# Patient Record
Sex: Female | Born: 1995 | Race: Black or African American | Hispanic: No | Marital: Single | Smoking: Current every day smoker
Health system: Southern US, Community
[De-identification: ages and names within clinical notes are randomized; demographics above are authoritative.]

## PROBLEM LIST (undated history)

## (undated) DIAGNOSIS — G43709 Chronic migraine without aura, not intractable, without status migrainosus: Secondary | ICD-10-CM

## (undated) DIAGNOSIS — IMO0002 Reserved for concepts with insufficient information to code with codable children: Secondary | ICD-10-CM

## (undated) HISTORY — PX: EXTERNAL EAR SURGERY: SHX627

---

## 2015-09-16 ENCOUNTER — Emergency Department (HOSPITAL_COMMUNITY)
Admission: EM | Admit: 2015-09-16 | Discharge: 2015-09-16 | Disposition: A | Discharge status: Home / Self Care | Payer: Self-pay | Attending: Emergency Medicine | Admitting: Emergency Medicine

## 2015-09-16 ENCOUNTER — Emergency Department (HOSPITAL_COMMUNITY): Payer: Self-pay

## 2015-09-16 ENCOUNTER — Encounter (HOSPITAL_COMMUNITY): Payer: Self-pay | Admitting: Neurology

## 2015-09-16 DIAGNOSIS — R509 Fever, unspecified: Secondary | ICD-10-CM | POA: Insufficient documentation

## 2015-09-16 DIAGNOSIS — R109 Unspecified abdominal pain: Secondary | ICD-10-CM | POA: Insufficient documentation

## 2015-09-16 DIAGNOSIS — Z87442 Personal history of urinary calculi: Secondary | ICD-10-CM | POA: Insufficient documentation

## 2015-09-16 DIAGNOSIS — R110 Nausea: Secondary | ICD-10-CM | POA: Insufficient documentation

## 2015-09-16 DIAGNOSIS — F17200 Nicotine dependence, unspecified, uncomplicated: Secondary | ICD-10-CM | POA: Insufficient documentation

## 2015-09-16 DIAGNOSIS — R42 Dizziness and giddiness: Secondary | ICD-10-CM | POA: Insufficient documentation

## 2015-09-16 LAB — URINE MICROSCOPIC-ADD ON

## 2015-09-16 LAB — CBC
HEMATOCRIT: 41.5 % (ref 36.0–46.0)
HEMOGLOBIN: 13.8 g/dL (ref 12.0–15.0)
MCH: 29.9 pg (ref 26.0–34.0)
MCHC: 33.3 g/dL (ref 30.0–36.0)
MCV: 89.8 fL (ref 78.0–100.0)
Platelets: 158 10*3/uL (ref 150–400)
RBC: 4.62 MIL/uL (ref 3.87–5.11)
RDW: 13.4 % (ref 11.5–15.5)
WBC: 9.7 10*3/uL (ref 4.0–10.5)

## 2015-09-16 LAB — COMPREHENSIVE METABOLIC PANEL
ALBUMIN: 4 g/dL (ref 3.5–5.0)
ALK PHOS: 58 U/L (ref 38–126)
ALT: 13 U/L — ABNORMAL LOW (ref 14–54)
ANION GAP: 10 (ref 5–15)
AST: 14 U/L — ABNORMAL LOW (ref 15–41)
BILIRUBIN TOTAL: 1.5 mg/dL — AB (ref 0.3–1.2)
BUN: 6 mg/dL (ref 6–20)
CALCIUM: 9.2 mg/dL (ref 8.9–10.3)
CO2: 22 mmol/L (ref 22–32)
Chloride: 106 mmol/L (ref 101–111)
Creatinine, Ser: 0.82 mg/dL (ref 0.44–1.00)
GFR calc non Af Amer: >60 mL/min (ref >60)
Glucose, Bld: 101 mg/dL — ABNORMAL HIGH (ref 65–99)
POTASSIUM: 3.5 mmol/L (ref 3.5–5.1)
SODIUM: 138 mmol/L (ref 135–145)
TOTAL PROTEIN: 7.2 g/dL (ref 6.5–8.1)

## 2015-09-16 LAB — URINALYSIS, ROUTINE W REFLEX MICROSCOPIC
BILIRUBIN URINE: NEGATIVE
Glucose, UA: NEGATIVE mg/dL
Ketones, ur: 15 mg/dL — AB
NITRITE: POSITIVE — AB
PH: 6 (ref 5.0–8.0)
Protein, ur: >300 mg/dL — AB
SPECIFIC GRAVITY, URINE: 1.019 (ref 1.005–1.030)

## 2015-09-16 LAB — I-STAT BETA HCG BLOOD, ED (MC, WL, AP ONLY)

## 2015-09-16 LAB — LIPASE, BLOOD: Lipase: 20 U/L (ref 11–51)

## 2015-09-16 MED ORDER — HYDROCODONE-ACETAMINOPHEN 5-325 MG PO TABS: 1.0000 | ORAL_TABLET | Freq: Four times a day (QID) | ORAL | Status: DC | PRN

## 2015-09-16 MED ORDER — CIPROFLOXACIN HCL 500 MG PO TABS: 500.0000 mg | ORAL_TABLET | Freq: Two times a day (BID) | ORAL | Status: DC

## 2015-09-16 MED ORDER — KETOROLAC TROMETHAMINE 30 MG/ML IJ SOLN: 30.0000 mg | Freq: Once | INTRAMUSCULAR | Status: DC

## 2015-09-16 MED ORDER — ONDANSETRON 4 MG PO TBDP
8.0000 mg | ORAL_TABLET | Freq: Once | ORAL | Status: AC
  Administered 2015-09-16: 8 mg via ORAL
  Filled 2015-09-16: qty 2

## 2015-09-16 MED ORDER — KETOROLAC TROMETHAMINE 30 MG/ML IJ SOLN
30.0000 mg | Freq: Once | INTRAMUSCULAR | Status: AC
  Administered 2015-09-16: 30 mg via INTRAMUSCULAR
  Filled 2015-09-16: qty 1

## 2015-09-16 MED ORDER — ONDANSETRON HCL 4 MG PO TABS: 4.0000 mg | ORAL_TABLET | Freq: Four times a day (QID) | ORAL | Status: AC

## 2015-09-16 MED ORDER — ONDANSETRON HCL 4 MG/2ML IJ SOLN: 4.0000 mg | Freq: Once | INTRAMUSCULAR | Status: DC

## 2015-09-16 NOTE — ED Notes (Signed)
Patient transported to CT 

## 2015-09-16 NOTE — Discharge Instructions (Signed)

## 2015-09-16 NOTE — ED Notes (Signed)
PA [facility] bedside.

## 2015-09-16 NOTE — ED Provider Notes (Signed)
CSN: [id number]     Arrival date & time 09/16/15  1458 History   First MD Initiated Contact with Patient 09/16/15 1804     Chief Complaint  Patient presents with  . Fever  . Abdominal Pain     (Consider location/radiation/quality/duration/timing/severity/associated sxs/prior Treatment) HPI Comments: Patient presents to the emergency department with chief complaint of left flank pain. She states the pain started last night. She reports pain is severe. She reports that she has had a kidney stone in the past, but does not remember if it felt this bad. She reports some associated nausea, but denies any vomiting, diarrhea, or constipation. She denies any dysuria or hematuria. She states that she is on her period. She has not tried taking anything to alleviate her symptoms. She reports associated subjective fever, but has not measured her temperature. Also states that she has felt slightly dizzy. She denies any prior abdominal surgeries. Denies any other associated symptoms.  The history is provided by the patient. No language interpreter was used.    History reviewed. No pertinent past medical history. History reviewed. No pertinent past surgical history. No family history on file. Social History  Substance Use Topics  . Smoking status: Current Every Day Smoker  . Smokeless tobacco: None  . Alcohol Use: No   OB History    No data available     Review of Systems  Constitutional: Negative for fever and chills.  Respiratory: Negative for shortness of breath.   Cardiovascular: Negative for chest pain.  Gastrointestinal: Positive for nausea and abdominal pain. Negative for vomiting, diarrhea and constipation.  Genitourinary: Negative for dysuria.  All other systems reviewed and are negative.     Allergies  Review of patient's allergies indicates no known allergies.  Home Medications   Prior to Admission medications   Not on File   BP 134/71 mmHg  Pulse 85  Temp(Src) 99 F  (37.2 C) (Oral)  Resp 22  SpO2 100%  LMP 09/16/2015 Physical Exam  Constitutional: She is oriented to person, place, and time. She appears well-developed and well-nourished.  HENT:  Head: Normocephalic and atraumatic.  Eyes: Conjunctivae and EOM are normal. Pupils are equal, round, and reactive to light.  Neck: Normal range of motion. Neck supple.  Cardiovascular: Normal rate and regular rhythm.  Exam reveals no gallop and no friction rub.   No murmur heard. Pulmonary/Chest: Effort normal and breath sounds normal. No respiratory distress. She has no wheezes. She has no rales. She exhibits no tenderness.  Abdominal: Soft. Bowel sounds are normal. She exhibits no distension and no mass. There is tenderness. There is no rebound and no guarding.  Left-sided CVA tenderness, there is left flank pain with palpation of any location of abdomen  Musculoskeletal: Normal range of motion. She exhibits no edema or tenderness.  Neurological: She is alert and oriented to person, place, and time.  Skin: Skin is warm and dry.  Psychiatric: She has a normal mood and affect. Her behavior is normal. Judgment and thought content normal.  Nursing note and vitals reviewed.   ED Course  Procedures (including critical care time) Results for orders placed or performed during the hospital encounter of 09/16/15  Lipase, blood  Result Value Ref Range   Lipase 20 11 - 51 U/L  Comprehensive metabolic panel  Result Value Ref Range   Sodium 138 135 - 145 mmol/L   Potassium 3.5 3.5 - 5.1 mmol/L   Chloride 106 101 - 111 mmol/L   CO2 22  22 - 32 mmol/L   Glucose, Bld 101 (H) 65 - 99 mg/dL   BUN 6 6 - 20 mg/dL   Creatinine, Ser 1.61 0.44 - 1.00 mg/dL   Calcium 9.2 8.9 - 09.6 mg/dL   Total Protein 7.2 6.5 - 8.1 g/dL   Albumin 4.0 3.5 - 5.0 g/dL   AST 14 (L) 15 - 41 U/L   ALT 13 (L) 14 - 54 U/L   Alkaline Phosphatase 58 38 - 126 U/L   Total Bilirubin 1.5 (H) 0.3 - 1.2 mg/dL   GFR calc non Af Amer >60 >60 mL/min    GFR calc Af Amer >60 >60 mL/min   Anion gap 10 5 - 15  CBC  Result Value Ref Range   WBC 9.7 4.0 - 10.5 K/uL   RBC 4.62 3.87 - 5.11 MIL/uL   Hemoglobin 13.8 12.0 - 15.0 g/dL   HCT 04.5 40.9 - 81.1 %   MCV 89.8 78.0 - 100.0 fL   MCH 29.9 26.0 - 34.0 pg   MCHC 33.3 30.0 - 36.0 g/dL   RDW 91.4 78.2 - 95.6 %   Platelets 158 150 - 400 K/uL  Urinalysis, Routine w reflex microscopic (not at [facility])  Result Value Ref Range   Color, Urine BROWN (A) YELLOW   APPearance TURBID (A) CLEAR   Specific Gravity, Urine 1.019 1.005 - 1.030   pH 6.0 5.0 - 8.0   Glucose, UA NEGATIVE NEGATIVE mg/dL   Hgb urine dipstick LARGE (A) NEGATIVE   Bilirubin Urine NEGATIVE NEGATIVE   Ketones, ur 15 (A) NEGATIVE mg/dL   Protein, ur >213 (A) NEGATIVE mg/dL   Nitrite POSITIVE (A) NEGATIVE   Leukocytes, UA LARGE (A) NEGATIVE  Urine microscopic-add on  Result Value Ref Range   Squamous Epithelial / LPF 6-30 (A) NONE SEEN   WBC, UA TOO NUMEROUS TO COUNT 0 - 5 WBC/hpf   RBC / HPF TOO NUMEROUS TO COUNT 0 - 5 RBC/hpf   Bacteria, UA MANY (A) NONE SEEN   Casts GRANULAR CAST (A) NEGATIVE   Urine-Other MUCOUS PRESENT   I-Stat beta hCG blood, ED ([facility], [facility], [facility] only)  Result Value Ref Range   I-stat hCG, quantitative <5.0 <5 mIU/mL   Comment 3           Ct Abdomen Pelvis Wo Contrast  09/16/2015  CLINICAL DATA:  Left flank and left lower quadrant pain since last night EXAM: CT ABDOMEN AND PELVIS WITHOUT CONTRAST TECHNIQUE: Multidetector CT imaging of the abdomen and pelvis was performed following the standard protocol without IV contrast. COMPARISON:  None. FINDINGS: There is left hydroureteronephrosis with stranding surrounding the left renal pelvis and ureter. No focal obstructing stone is identified in the bilateral collecting systems. No nephrolithiasis is identified bilaterally. The liver, spleen, pancreas, gallbladder, adrenal glands are normal. The aorta is normal. There is no abdominal lymphadenopathy. There is no  small bowel obstruction or diverticulitis. The appendix is normal. Partial fluid-filled bladder is normal. The uterus is normal. The lung bases are clear. 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 IMPRESSION: Left hydroureteronephrosis without focal obstructing stone. Differential diagnosis includes recently passed stone or pyelonephritis. Electronically Signed   By: Sherian Rein M.D.   On: 09/16/2015 19:27    I have personally reviewed and evaluated these images and lab results as part of my medical decision-making.   EKG Interpretation None      MDM   Final diagnoses:  Flank pain    Patient with left  flank pain. 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  Pain at left flank with palpation of abdomen.  Afebrile.  VSS. Will treat with toradol and zofran.  Labs are unremarkable.  Urinalysis is dirty.  Likely contaminated with menstrual discharge.  Will check urine culture.  Patient does have hydronephrosis on left.  Could be recently passed stone vs pyelo.   Will treat with cipro, zofran, and norco.  Return precautions given.  Patient is stable and ready for discharge.     Roxy Horsemanobert Brandalynn Ofallon, PA-C 09/16/15 1953  Rolland PorterMark James, MD 09/23/15 [phone]

## 2015-09-16 NOTE — ED Notes (Signed)
Pt reports LLQ abd pain, fever, dizziness since last night. Denies vomiting. Denies urinary s/s, is on her period.

## 2016-03-28 ENCOUNTER — Emergency Department (HOSPITAL_COMMUNITY)
Admission: EM | Admit: 2016-03-28 | Discharge: 2016-03-28 | Disposition: A | Discharge status: Home / Self Care | Payer: BLUE CROSS/BLUE SHIELD | Source: Home / Self Care

## 2016-03-28 ENCOUNTER — Inpatient Hospital Stay (HOSPITAL_COMMUNITY)
Admission: EM | Admit: 2016-03-28 | Discharge: 2016-03-30 | DRG: 781 | Disposition: A | Discharge status: Home / Self Care | Payer: BLUE CROSS/BLUE SHIELD | Attending: Family Medicine | Admitting: Family Medicine

## 2016-03-28 ENCOUNTER — Encounter (HOSPITAL_COMMUNITY): Payer: Self-pay

## 2016-03-28 ENCOUNTER — Encounter (HOSPITAL_COMMUNITY): Payer: Self-pay | Admitting: Emergency Medicine

## 2016-03-28 DIAGNOSIS — Z9889 Other specified postprocedural states: Secondary | ICD-10-CM

## 2016-03-28 DIAGNOSIS — Z3A01 Less than 8 weeks gestation of pregnancy: Secondary | ICD-10-CM | POA: Diagnosis not present

## 2016-03-28 DIAGNOSIS — L02415 Cutaneous abscess of right lower limb: Secondary | ICD-10-CM

## 2016-03-28 DIAGNOSIS — L0390 Cellulitis, unspecified: Secondary | ICD-10-CM

## 2016-03-28 DIAGNOSIS — O2341 Unspecified infection of urinary tract in pregnancy, first trimester: Secondary | ICD-10-CM | POA: Diagnosis present

## 2016-03-28 DIAGNOSIS — F17200 Nicotine dependence, unspecified, uncomplicated: Secondary | ICD-10-CM | POA: Diagnosis present

## 2016-03-28 DIAGNOSIS — F17210 Nicotine dependence, cigarettes, uncomplicated: Secondary | ICD-10-CM

## 2016-03-28 DIAGNOSIS — L0291 Cutaneous abscess, unspecified: Secondary | ICD-10-CM | POA: Diagnosis not present

## 2016-03-28 DIAGNOSIS — Z79899 Other long term (current) drug therapy: Secondary | ICD-10-CM

## 2016-03-28 DIAGNOSIS — L03115 Cellulitis of right lower limb: Secondary | ICD-10-CM | POA: Diagnosis present

## 2016-03-28 DIAGNOSIS — O209 Hemorrhage in early pregnancy, unspecified: Secondary | ICD-10-CM | POA: Diagnosis present

## 2016-03-28 DIAGNOSIS — Z331 Pregnant state, incidental: Secondary | ICD-10-CM | POA: Diagnosis not present

## 2016-03-28 DIAGNOSIS — Z5321 Procedure and treatment not carried out due to patient leaving prior to being seen by health care provider: Secondary | ICD-10-CM | POA: Insufficient documentation

## 2016-03-28 DIAGNOSIS — R509 Fever, unspecified: Secondary | ICD-10-CM | POA: Diagnosis present

## 2016-03-28 DIAGNOSIS — Z833 Family history of diabetes mellitus: Secondary | ICD-10-CM | POA: Diagnosis not present

## 2016-03-28 DIAGNOSIS — O99331 Smoking (tobacco) complicating pregnancy, first trimester: Secondary | ICD-10-CM | POA: Diagnosis present

## 2016-03-28 DIAGNOSIS — O26891 Other specified pregnancy related conditions, first trimester: Secondary | ICD-10-CM | POA: Diagnosis present

## 2016-03-28 DIAGNOSIS — Z3490 Encounter for supervision of normal pregnancy, unspecified, unspecified trimester: Secondary | ICD-10-CM

## 2016-03-28 HISTORY — DX: Reserved for concepts with insufficient information to code with codable children: IMO0002

## 2016-03-28 HISTORY — DX: Chronic migraine without aura, not intractable, without status migrainosus: G43.709

## 2016-03-28 LAB — COMPREHENSIVE METABOLIC PANEL
ALBUMIN: 3.6 g/dL (ref 3.5–5.0)
ALT: 22 U/L (ref 14–54)
ANION GAP: 5 (ref 5–15)
AST: 21 U/L (ref 15–41)
Alkaline Phosphatase: 55 U/L (ref 38–126)
BILIRUBIN TOTAL: 1 mg/dL (ref 0.3–1.2)
BUN: 6 mg/dL (ref 6–20)
CHLORIDE: 106 mmol/L (ref 101–111)
CO2: 23 mmol/L (ref 22–32)
Calcium: 8.8 mg/dL — ABNORMAL LOW (ref 8.9–10.3)
Creatinine, Ser: 0.8 mg/dL (ref 0.44–1.00)
GFR calc Af Amer: >60 mL/min (ref >60)
GLUCOSE: 93 mg/dL (ref 65–99)
POTASSIUM: 4.2 mmol/L (ref 3.5–5.1)
Sodium: 134 mmol/L — ABNORMAL LOW (ref 135–145)
TOTAL PROTEIN: 6.7 g/dL (ref 6.5–8.1)

## 2016-03-28 LAB — I-STAT BETA HCG BLOOD, ED (MC, WL, AP ONLY): I-stat hCG, quantitative: >2000 m[IU]/mL — ABNORMAL HIGH (ref <5)

## 2016-03-28 LAB — CBC WITH DIFFERENTIAL/PLATELET
BASOS ABS: 0 10*3/uL (ref 0.0–0.1)
Basophils Relative: 0 %
Eosinophils Absolute: 0.1 10*3/uL (ref 0.0–0.7)
Eosinophils Relative: 2 %
HEMATOCRIT: 35.7 % — AB (ref 36.0–46.0)
HEMOGLOBIN: 12 g/dL (ref 12.0–15.0)
LYMPHS ABS: 1.2 10*3/uL (ref 0.7–4.0)
LYMPHS PCT: 14 %
MCH: 29.3 pg (ref 26.0–34.0)
MCHC: 33.6 g/dL (ref 30.0–36.0)
MCV: 87.3 fL (ref 78.0–100.0)
Monocytes Absolute: 0.6 10*3/uL (ref 0.1–1.0)
Monocytes Relative: 6 %
NEUTROS ABS: 7.1 10*3/uL (ref 1.7–7.7)
Neutrophils Relative %: 78 %
Platelets: 167 10*3/uL (ref 150–400)
RBC: 4.09 MIL/uL (ref 3.87–5.11)
RDW: 13.4 % (ref 11.5–15.5)
WBC: 9 10*3/uL (ref 4.0–10.5)

## 2016-03-28 MED ORDER — CLINDAMYCIN PHOSPHATE 900 MG/50ML IV SOLN
900.0000 mg | Freq: Once | INTRAVENOUS | Status: AC
  Administered 2016-03-28: 900 mg via INTRAVENOUS
  Filled 2016-03-28: qty 50

## 2016-03-28 MED ORDER — MORPHINE SULFATE (PF) 4 MG/ML IV SOLN
4.0000 mg | Freq: Once | INTRAVENOUS | Status: AC
  Administered 2016-03-28: 4 mg via INTRAVENOUS
  Filled 2016-03-28: qty 1

## 2016-03-28 NOTE — ED Notes (Signed)
Pt states that she came in today after having the packing taken out of an abscess that was lanced on Saturday. The area is on her R inner thigh and is very red and appears to be infected. Sent in for further eval/antibiotics. Alert and oriented.

## 2016-03-28 NOTE — ED Notes (Signed)
Patient states that she had pain in her upper right thigh from a boil that was drained at an urgent care on Saturday 6/10. It was drained and packed with gauze, dressing still in place. Site appears red and swollen, warm and extremely painful to touch.

## 2016-03-28 NOTE — ED Provider Notes (Signed)
CSN: [id number]     Arrival date & time 03/28/16  1745 History   First MD Initiated Contact with Patient 03/28/16 2100     Chief Complaint  Patient presents with  . Wound Infection     (Consider location/radiation/quality/duration/timing/severity/associated sxs/prior Treatment) HPI  Patient is a 20 yo F with no significant PMHx presenting to the emergency room complaining of worsening pain, erythema, and swelling of a boil on her right inner thigh. Reports noticing a boil on right inner thigh 4 days ago which became worse in a day. States she went to urgent care 2 days ago and they cut it open and packed it. She was given bactrim and naproxen by urgent care which she has been taking daily. Patient is concerned that the erythema has now extended beyond her dressing and the area continues to be intermittently painful. Reports having fevers and chills. Reports feeling nauseous for the past 2 weeks; no vomiting. States her LMP was on May1st. No other complaints.   History reviewed. No pertinent past medical history. History reviewed. No pertinent past surgical history. History reviewed. No pertinent family history. Social History  Substance Use Topics  . Smoking status: Current Every Day Smoker  . Smokeless tobacco: None  . Alcohol Use: No   OB History    No data available     Review of Systems  Constitutional: Positive for fever and chills.  HENT: Negative for congestion and sore throat.   Eyes: Negative for pain and visual disturbance.  Respiratory: Negative for cough, shortness of breath and wheezing.   Cardiovascular: Negative for chest pain and palpitations.  Gastrointestinal: Negative for nausea, vomiting and abdominal distention.  Genitourinary: Negative for dysuria and flank pain.  Musculoskeletal: Negative for myalgias and arthralgias.  Skin: Negative for pallor and rash.       Boil on right inner thigh   Neurological: Negative for weakness, light-headedness and numbness.       Allergies  Review of patient's allergies indicates no known allergies.  Home Medications   Prior to Admission medications   Medication Sig Start Date End Date Taking? Authorizing Provider  ibuprofen (ADVIL,MOTRIN) 200 MG tablet Take 200 mg by mouth every 6 (six) hours as needed for moderate pain.    Yes Historical Provider, MD  naproxen (NAPROSYN) 500 MG tablet Take 500 mg by mouth 2 (two) times daily. for 10 days 03/26/16  Yes Historical Provider, MD  naproxen sodium (ANAPROX) 220 MG tablet Take 440 mg by mouth 2 (two) times daily as needed (pain and headache).   Yes Historical Provider, MD  sulfamethoxazole-trimethoprim (BACTRIM DS,SEPTRA DS) 800-160 MG tablet take 1 tablet by mouth every 12 hours for 10 days 03/26/16  Yes Historical Provider, MD  ciprofloxacin (CIPRO) 500 MG tablet Take 1 tablet (500 mg total) by mouth 2 (two) times daily. Patient not taking: Reported on 03/28/2016 09/16/15   Roxy Horseman, PA-C  HYDROcodone-acetaminophen (NORCO/VICODIN) 5-325 MG tablet Take 1-2 tablets by mouth every 6 (six) hours as needed. Patient not taking: Reported on 03/28/2016 09/16/15   Roxy Horseman, PA-C  ondansetron (ZOFRAN) 4 MG tablet Take 1 tablet (4 mg total) by mouth every 6 (six) hours. Patient not taking: Reported on 03/28/2016 09/16/15   Roxy Horseman, PA-C   BP 132/59 mmHg  Pulse 96  Temp(Src) 98.6 F (37 C) (Oral)  Resp 18  SpO2 100%  LMP 02/16/2016 Physical Exam  Constitutional: She is oriented to person, place, and time. She appears well-developed and well-nourished. No distress.  HENT:  Head: Normocephalic and atraumatic.  Mouth/Throat: Oropharynx is clear and moist.  Eyes: EOM are normal. Pupils are equal, round, and reactive to light.  Neck: Neck supple. No tracheal deviation present.  Cardiovascular: Normal rate, regular rhythm and intact distal pulses.  Exam reveals no gallop and no friction rub.   No murmur heard. Pulmonary/Chest: Effort normal and breath  sounds normal. No respiratory distress. She has no wheezes. She has no rales.  Abdominal: Soft. Bowel sounds are normal. She exhibits no distension. There is no tenderness. There is no guarding.  Musculoskeletal: Normal range of motion. She exhibits no tenderness.  Neurological: She is alert and oriented to person, place, and time.  Skin: Skin is warm and dry.  Abscess on right inner thigh with erythema and increased warmth extending beyond the area of packing. No pus noted.  Please see attached image.       ED Course  Procedures (including critical care time) Labs Review Labs Reviewed - No data to display  Imaging Review No results found. I have personally reviewed and evaluated these images and lab results as part of my medical decision-making.   EKG Interpretation None      MDM   Final diagnoses:  None   Pregnancy  Patient has been complaining of nausea for the past 2 weeks. LMP >7 weeks ago. Beta hCG >2000.   Right thigh cellulitis Patient is presenting with an abscess on her right inner thigh with surrounding erythema and increased warmth of skin despite taking Bactrim. She is afebrile and does not have leukocytosis. However, since she has failed outpatient bactrim therapy, she will need treatment with IV antibiotics.  -IV Clindamycin 900 mg once  -Morphine 4 mg once for pain -Wound culture pending  -Spoke to Dr. Melynda RippleHobbs ([facility]) and patient has been admitted under his care for further management.    John GiovanniVasundhra Quinnton Bury, MD 03/28/16 [phone]  Lyndal Pulleyaniel Knott, MD 03/29/16 [phone]

## 2016-03-28 NOTE — ED Notes (Signed)
Patient stated that she could not wait any longer so she left without being seen, called patient 3 times patient didn't answer

## 2016-03-29 ENCOUNTER — Encounter (HOSPITAL_COMMUNITY): Payer: Self-pay

## 2016-03-29 ENCOUNTER — Inpatient Hospital Stay (HOSPITAL_COMMUNITY): Payer: BLUE CROSS/BLUE SHIELD

## 2016-03-29 DIAGNOSIS — L0291 Cutaneous abscess, unspecified: Secondary | ICD-10-CM

## 2016-03-29 DIAGNOSIS — L03115 Cellulitis of right lower limb: Secondary | ICD-10-CM | POA: Diagnosis present

## 2016-03-29 DIAGNOSIS — L0390 Cellulitis, unspecified: Secondary | ICD-10-CM

## 2016-03-29 LAB — BASIC METABOLIC PANEL
Anion gap: 5 (ref 5–15)
BUN: 9 mg/dL (ref 6–20)
CHLORIDE: 107 mmol/L (ref 101–111)
CO2: 23 mmol/L (ref 22–32)
CREATININE: 0.68 mg/dL (ref 0.44–1.00)
Calcium: 8.2 mg/dL — ABNORMAL LOW (ref 8.9–10.3)
GFR calc non Af Amer: >60 mL/min (ref >60)
Glucose, Bld: 101 mg/dL — ABNORMAL HIGH (ref 65–99)
POTASSIUM: 3.5 mmol/L (ref 3.5–5.1)
Sodium: 135 mmol/L (ref 135–145)

## 2016-03-29 LAB — ABO/RH: ABO/RH(D): A POS

## 2016-03-29 LAB — CBC
HEMATOCRIT: 32.2 % — AB (ref 36.0–46.0)
Hemoglobin: 11.2 g/dL — ABNORMAL LOW (ref 12.0–15.0)
MCH: 29.9 pg (ref 26.0–34.0)
MCHC: 34.8 g/dL (ref 30.0–36.0)
MCV: 85.9 fL (ref 78.0–100.0)
Platelets: 173 10*3/uL (ref 150–400)
RBC: 3.75 MIL/uL — AB (ref 3.87–5.11)
RDW: 13.4 % (ref 11.5–15.5)
WBC: 8.6 10*3/uL (ref 4.0–10.5)

## 2016-03-29 LAB — HCG, QUANTITATIVE, PREGNANCY: hCG, Beta Chain, Quant, S: 10978 m[IU]/mL — ABNORMAL HIGH (ref <5)

## 2016-03-29 LAB — TYPE AND SCREEN
ABO/RH(D): A POS
ANTIBODY SCREEN: NEGATIVE

## 2016-03-29 MED ORDER — ONDANSETRON HCL 4 MG/2ML IJ SOLN: 4.0000 mg | Freq: Four times a day (QID) | INTRAMUSCULAR | Status: DC | PRN

## 2016-03-29 MED ORDER — ACETAMINOPHEN 325 MG PO TABS: 650.0000 mg | ORAL_TABLET | Freq: Four times a day (QID) | ORAL | Status: DC | PRN

## 2016-03-29 MED ORDER — PRENATAL MULTIVITAMIN CH
1.0000 | ORAL_TABLET | Freq: Every day | ORAL | Status: DC
  Administered 2016-03-29 – 2016-03-30 (×2): 1 via ORAL
  Filled 2016-03-29 (×3): qty 1

## 2016-03-29 MED ORDER — ACETAMINOPHEN 650 MG RE SUPP: 650.0000 mg | Freq: Four times a day (QID) | RECTAL | Status: DC | PRN

## 2016-03-29 MED ORDER — KCL IN DEXTROSE-NACL 20-5-0.45 MEQ/L-%-% IV SOLN
INTRAVENOUS | Status: AC
  Administered 2016-03-29: 01:00:00 via INTRAVENOUS
  Filled 2016-03-29: qty 1000

## 2016-03-29 MED ORDER — ONDANSETRON HCL 4 MG PO TABS
4.0000 mg | ORAL_TABLET | Freq: Four times a day (QID) | ORAL | Status: DC | PRN
  Administered 2016-03-29: 4 mg via ORAL
  Filled 2016-03-29: qty 1

## 2016-03-29 MED ORDER — CLINDAMYCIN PHOSPHATE 600 MG/50ML IV SOLN
600.0000 mg | Freq: Three times a day (TID) | INTRAVENOUS | Status: DC
  Administered 2016-03-30: 600 mg via INTRAVENOUS
  Filled 2016-03-29: qty 50

## 2016-03-29 MED ORDER — TRAZODONE HCL 50 MG PO TABS: 25.0000 mg | ORAL_TABLET | Freq: Every evening | ORAL | Status: DC | PRN

## 2016-03-29 NOTE — H&P (Signed)
History and Physical    Misty Bowen ZOX:[id number] DOB: [birth year] DOA: 03/28/2016  PCP: Pcp Not In System Patient coming from: home  Chief Complaint: "Infection just got bad."  HPI: Misty Bowen is a 20 y.o. female with medical history significant for chronic migraine presented to the emergency room for evaluation of skin infection of the right leg. Patient had gone to urgent care for evaluation of a boil on her right inner thigh. Patient has a history of these. Patient states that at urgent care this was lanced and was packed. Patient was started on Bactrim. Patient took the medication as directed. Patient went back for wound check 2 days later for evaluation and the wound was found to be very cellulitic. Patient was directed to come to the emergency room and did so the same day.  Of note patient has been nauseous for the last 2 weeks but no vomiting. Patient denies fever, chills, chest pain, trouble breathing, headache, syncope and weakness.  ED Course: Patient given clindamycin in the emergency room.  Review of Systems: As per HPI otherwise 10 point review of systems negative.   Past Medical History  Diagnosis Date  . Chronic migraine     Past Surgical History  Procedure Laterality Date  . External ear surgery Right      reports that she has been smoking.  She does not have any smokeless tobacco history on file. She reports that she does not drink alcohol. Her drug history is not on file.  No Known Allergies  Family History  Problem Relation Age of Onset  . Diabetes Maternal Grandmother     Prior to Admission medications   Medication Sig Start Date End Date Taking? Authorizing Provider  ibuprofen (ADVIL,MOTRIN) 200 MG tablet Take 200 mg by mouth every 6 (six) hours as needed for moderate pain.    Yes Historical Provider, MD  naproxen (NAPROSYN) 500 MG tablet Take 500 mg by mouth 2 (two) times daily. for 10 days 03/26/16  Yes Historical Provider, MD  naproxen sodium (ANAPROX)  220 MG tablet Take 440 mg by mouth 2 (two) times daily as needed (pain and headache).   Yes Historical Provider, MD  sulfamethoxazole-trimethoprim (BACTRIM DS,SEPTRA DS) 800-160 MG tablet take 1 tablet by mouth every 12 hours for 10 days 03/26/16  Yes Historical Provider, MD  ciprofloxacin (CIPRO) 500 MG tablet Take 1 tablet (500 mg total) by mouth 2 (two) times daily. Patient not taking: Reported on 03/28/2016 09/16/15   Roxy Horseman, PA-C  HYDROcodone-acetaminophen (NORCO/VICODIN) 5-325 MG tablet Take 1-2 tablets by mouth every 6 (six) hours as needed. Patient not taking: Reported on 03/28/2016 09/16/15   Roxy Horseman, PA-C  ondansetron (ZOFRAN) 4 MG tablet Take 1 tablet (4 mg total) by mouth every 6 (six) hours. Patient not taking: Reported on 03/28/2016 09/16/15   Roxy Horseman, PA-C    Physical Exam: Filed Vitals:   03/28/16 1912 03/28/16 2313 03/29/16 0020  BP: 132/59 126/56 137/90  Pulse: 96 76 84  Temp: 98.6 F (37 C)  98.6 F (37 C)  TempSrc: Oral  Oral  Resp: 18 16 18   SpO2: 100% 98% 100%      Constitutional: NAD, calm, comfortable Filed Vitals:   03/28/16 1912 03/28/16 2313 03/29/16 0020  BP: 132/59 126/56 137/90  Pulse: 96 76 84  Temp: 98.6 F (37 C)  98.6 F (37 C)  TempSrc: Oral  Oral  Resp: 18 16 18   SpO2: 100% 98% 100%   Eyes: PERRL, lids and conjunctivae  normal ENMT: Mucous membranes are moist. Posterior pharynx clear of any exudate or lesions.Normal dentition.  Neck: normal, supple, no masses, no thyromegaly Respiratory: clear to auscultation bilaterally, no wheezing, no crackles. Normal respiratory effort. No accessory muscle use.  Cardiovascular: Regular rate and rhythm, no murmurs / rubs / gallops. No extremity edema. 2+ pedal pulses. No carotid bruits.  Abdomen: no tenderness, no masses palpated. No hepatosplenomegaly. Bowel sounds positive.  Musculoskeletal: no clubbing / cyanosis. No joint deformity upper and lower extremities. Good ROM, no  contractures. Normal muscle tone.  Skin: Left proximal inner thigh cellulitis greater than 10 cm in diameter. Central induration with area where packing was removed leaving small defect. No expressible pus from defect. Neurologic: CN 2-12 grossly intact. Sensation intact, DTR normal. Strength 5/5 in all 4.  Psychiatric: Normal judgment and insight. Alert and oriented x 3. Normal mood.   Labs on Admission: I have personally reviewed following labs and imaging studies  CBC:  Recent Labs Lab 03/28/16 1328  WBC 9.0  NEUTROABS 7.1  HGB 12.0  HCT 35.7*  MCV 87.3  PLT 167   Basic Metabolic Panel:  Recent Labs Lab 03/28/16 1328  NA 134*  K 4.2  CL 106  CO2 23  GLUCOSE 93  BUN 6  CREATININE 0.80  CALCIUM 8.8*   GFR: Estimated Creatinine Clearance: 142.6 mL/min (by C-G formula based on Cr of 0.8). Liver Function Tests:  Recent Labs Lab 03/28/16 1328  AST 21  ALT 22  ALKPHOS 55  BILITOT 1.0  PROT 6.7  ALBUMIN 3.6   No results for input(s): LIPASE, AMYLASE in the last 168 hours. No results for input(s): AMMONIA in the last 168 hours. Coagulation Profile: No results for input(s): INR, PROTIME in the last 168 hours. Cardiac Enzymes: No results for input(s): CKTOTAL, CKMB, CKMBINDEX, TROPONINI in the last 168 hours. BNP (last 3 results) No results for input(s): PROBNP in the last 8760 hours. HbA1C: No results for input(s): HGBA1C in the last 72 hours. CBG: No results for input(s): GLUCAP in the last 168 hours. Lipid Profile: No results for input(s): CHOL, HDL, LDLCALC, TRIG, CHOLHDL, LDLDIRECT in the last 72 hours. Thyroid Function Tests: No results for input(s): TSH, T4TOTAL, FREET4, T3FREE, THYROIDAB in the last 72 hours. Anemia Panel: No results for input(s): VITAMINB12, FOLATE, FERRITIN, TIBC, IRON, RETICCTPCT in the last 72 hours. Urine analysis:    Component Value Date/Time   COLORURINE BROWN* 09/16/2015 1912   APPEARANCEUR TURBID* 09/16/2015 1912    LABSPEC 1.019 09/16/2015 1912   PHURINE 6.0 09/16/2015 1912   GLUCOSEU NEGATIVE 09/16/2015 1912   HGBUR LARGE* 09/16/2015 1912   BILIRUBINUR NEGATIVE 09/16/2015 1912   KETONESUR 15* 09/16/2015 1912   PROTEINUR >300* 09/16/2015 1912   NITRITE POSITIVE* 09/16/2015 1912   LEUKOCYTESUR LARGE* 09/16/2015 1912   Sepsis Labs: !!!!!!!!!!!!!!!!!!!!!!!!!!!!!!!!!!!!!!!!!!!! (procalcitonin:4,lacticidven:4) )No results found for this or any previous visit (from the past 240 hour(s)).   Radiological Exams on Admission: No results found.  EKG: pending  Assessment/Plan Principal Problem:   Cellulitis and abscess Active Problems:   Pregnancy   Cellulitis of right thigh   Abscess with cellulitis Wound culture sent Rocephin 2 g every 24 this will also cover patient's UTI Clindamycin IV every 8 hours Outlined cellulitis with body marker I suspect basal history patient has hidradenitis suppurativa  Pregnancy Transvaginal ultrasound ordered Prenatal vitamins daily  Chronic migraine When necessary Tylenol  DVT prophylaxis: SCD Code Status: Full Family Communication: Significant other in room Disposition Plan: Home Consults  called: None   Haydee SalterPhillip M Hobbs MD [facility] Pager [phone]  If 7PM-7AM, please contact night-coverage [email or web address] Password [email or web address]  03/29/2016, 12:47 AM

## 2016-03-29 NOTE — Progress Notes (Signed)
[facility] PLAN OF CARE NOTE PatientCharlotte Bowen: Misty Bowen IEP:[id number]:[id number]   PCP: Pcp Not In System DOB: 11-13-1995   DOA: 03/28/2016   DOS: 03/29/2016    Patient was admitted by my colleague Dr.Hobbs earlier on 03/29/2016. I have reviewed the H&P as well as assessment and plan and agree with the same. Important changes in the plan are listed below.  Plan of care: Principal Problem:   Cellulitis and abscess Status post IND in the urgent care. Still has open wound. There is no pus draining. Patient mentions wound is actually getting better. I will check ultrasound of the local area to identify possible abscess. The patient has further abscesses may require surgical consult to further IND.  Active Problems:   Pregnancy G1 P0 A0   Subchorionic hemorrhage. Ultrasound pelvis suggesting subchorionic hemorrhage. 5-6 weeks of pregnancy intrauterine 9, hCG consistent with pregnancy age. Continue prenatal vitamins. Ceftriaxone and clindamycin should be okay for discharge. Discussed with on-call OB/GYN and recommends no further workup and outpatient follow-up.  UTI. Patient has evidence of leukocytosis. With pregnancy this needs to be treated. Continue ceftriaxone until cultures and sensitivities are back.   Author: Lynden OxfordPranav Naevia Unterreiner, MD [facility] Pager: [phone] 03/29/2016 5:23 PM   If 7PM-7AM, please contact night-coverage at [email or web address], password [email or web address]

## 2016-03-30 DIAGNOSIS — Z331 Pregnant state, incidental: Secondary | ICD-10-CM

## 2016-03-30 LAB — URINE CULTURE: Culture: <10000 — AB

## 2016-03-30 LAB — CBC
HCT: 35.8 % — ABNORMAL LOW (ref 36.0–46.0)
HEMOGLOBIN: 12.2 g/dL (ref 12.0–15.0)
MCH: 29.8 pg (ref 26.0–34.0)
MCHC: 34.1 g/dL (ref 30.0–36.0)
MCV: 87.5 fL (ref 78.0–100.0)
Platelets: 197 10*3/uL (ref 150–400)
RBC: 4.09 MIL/uL (ref 3.87–5.11)
RDW: 13.3 % (ref 11.5–15.5)
WBC: 7.7 10*3/uL (ref 4.0–10.5)

## 2016-03-30 LAB — URINALYSIS, ROUTINE W REFLEX MICROSCOPIC
Bilirubin Urine: NEGATIVE
GLUCOSE, UA: NEGATIVE mg/dL
HGB URINE DIPSTICK: NEGATIVE
Ketones, ur: NEGATIVE mg/dL
LEUKOCYTES UA: NEGATIVE
Nitrite: NEGATIVE
Protein, ur: NEGATIVE mg/dL
SPECIFIC GRAVITY, URINE: 1.02 (ref 1.005–1.030)
pH: 6.5 (ref 5.0–8.0)

## 2016-03-30 MED ORDER — CLINDAMYCIN HCL 300 MG PO CAPS
450.0000 mg | ORAL_CAPSULE | Freq: Once | ORAL | Status: AC
  Administered 2016-03-30: 450 mg via ORAL
  Filled 2016-03-30: qty 1

## 2016-03-30 MED ORDER — PRENATAL MULTIVITAMIN CH: 1.0000 | ORAL_TABLET | Freq: Every day | ORAL | Status: AC

## 2016-03-30 MED ORDER — CLINDAMYCIN HCL 150 MG PO CAPS: 450.0000 mg | ORAL_CAPSULE | Freq: Three times a day (TID) | ORAL | Status: AC

## 2016-03-30 NOTE — Progress Notes (Signed)
Per nurse Stark KleinElsie, she received results of patient wound culture, + gram cocci, Md notified.

## 2016-03-30 NOTE — Progress Notes (Signed)
Patient d/c instructions given, verbalized understanding. Patient ambulated this shift. No c/o pain.

## 2016-03-30 NOTE — Discharge Summary (Addendum)
Physician Discharge Summary  [facility] UJW:[id number] DOB: [birth year] DOA: 03/28/2016  PCP: Pcp Not In System  Admit date: 03/28/2016 Discharge date: 03/30/2016  Time spent: 35* minutes  Recommendations for Outpatient Follow-up:  1. Follow PCP in 2 weeks   Discharge Diagnoses:  Principal Problem:   Cellulitis and abscess Active Problems:   Pregnancy   Cellulitis of right thigh   Discharge Condition: Stable  Diet recommendation: Regular diet  Filed Weights   03/29/16 0020  Weight: 119.296 kg (263 lb)    History of present illness:  20 y.o. female with medical history significant for chronic migraine presented to the emergency room for evaluation of skin infection of the right leg. Patient had gone to urgent care for evaluation of a boil on her right inner thigh. Patient has a history of these. Patient states that at urgent care this was lanced and was packed. Patient was started on Bactrim. Patient took the medication as directed. Patient went back for wound check 2 days later for evaluation and the wound was found to be very cellulitic. Patient was directed to come to the emergency room and did so the same day.  Hospital Course:  Cellulitis and abscess Status post I&D in the urgent care. Still has open wound. There is no pus draining.  Ultrasound of the thigh was done which did not show any abscess. Culture of the abscess Showed few gram-positive cocci. We will discharge patient on clindamycin 450 mg by mouth 3 times a day for 5 more days.   Subchorionic hemorrhage. Ultrasound pelvis suggesting subchorionic hemorrhage. 5-6 weeks of pregnancy intrauterine 9, hCG consistent with pregnancy age. Continue prenatal vitamins.  clindamycin should be okay for discharge. Discussed with on-call OB/GYN and recommends no further workup and outpatient follow-up.  Patient was empirically started on ceftriaxone, but UA and urine culture negative. Patient denies any symptoms of  dysuria. Will discontinue ceftriaxone at this time.  Procedures:  Transvaginal ultrasound  Ultrasound of thigh  Consultations:  None  Discharge Exam: Filed Vitals:   03/30/16 0615 03/30/16 0646  BP: 95/48 107/54  Pulse: 69   Temp: 98.5 F (36.9 C)   Resp: 18     General: Appears in no acute distress Cardiovascular: S1-S2 regular Respiratory: Clear to auscultation bilaterally Extremity- open wound noted on the right inner thigh, small induration but no visible drainage. No erythema. Mildly tender to palpation  Discharge Instructions   Discharge Instructions    Diet - low sodium heart healthy    Complete by:  As directed      Increase activity slowly    Complete by:  As directed           Current Discharge Medication List    START taking these medications   Details  clindamycin (CLEOCIN) 150 MG capsule Take 3 capsules (450 mg total) by mouth 3 (three) times daily. Qty: 15 capsule, Refills: 0    Prenatal Vit-Fe Fumarate-FA (PRENATAL MULTIVITAMIN) TABS tablet Take 1 tablet by mouth daily at 12 noon. Qty: 30 tablet, Refills: 2      CONTINUE these medications which have NOT CHANGED   Details  ondansetron (ZOFRAN) 4 MG tablet Take 1 tablet (4 mg total) by mouth every 6 (six) hours. Qty: 12 tablet, Refills: 0      STOP taking these medications     ibuprofen (ADVIL,MOTRIN) 200 MG tablet      naproxen (NAPROSYN) 500 MG tablet      naproxen sodium (ANAPROX) 220 MG tablet  sulfamethoxazole-trimethoprim (BACTRIM DS,SEPTRA DS) 800-160 MG tablet      ciprofloxacin (CIPRO) 500 MG tablet      HYDROcodone-acetaminophen (NORCO/VICODIN) 5-325 MG tablet        No Known Allergies    The results of significant diagnostics from this hospitalization (including imaging, microbiology, ancillary and laboratory) are listed below for reference.    Significant Diagnostic Studies: US Ob Comp Less 14 Wks  04-04-2016  CLINICAL DATA:  Nausea for 2 weeks. Estimated  gestational age by LMP is 6 weeks 1 day. Quantitative beta HCG is 72,000. EXAM: OBSTETRIC <14 WK [place] AND TRANSVAGINAL OB US TECHNIQUE: Both transabdominal and transvaginal ultrasound examinations were performed for complete evaluation of the gestation as well as the maternal uterus, adnexal regions, and pelvic cul-de-sac. Transvaginal technique was performed to assess early pregnancy. COMPARISON:  None. FINDINGS: Intrauterine gestational sac: A single intrauterine gestational sac is identified. Yolk sac:  Present Embryo:  Present Cardiac Activity: Present Heart Rate: 98  bpm CRL:  2.4  mm   5 w   6 d                  [place] EDC: 11/23/2016 Subchorionic hemorrhage: Small subchorionic hemorrhage inferior to the gestational sac. Maternal uterus/adnexae: Uterus is anteverted. No myometrial mass lesions identified. Both ovaries are visualized. Physiologic cysts are present. No abnormal adnexal masses. Small amount of free fluid in the pelvis. IMPRESSION: Single intrauterine pregnancy. Estimated gestational age by crown-rump length is 5 weeks 6 days. Small subchorionic hemorrhage. Electronically Signed   By: Burman Nieves M.D.   On: 04-04-16 01:27   US Ob Transvaginal  04-Apr-2016  CLINICAL DATA:  Nausea for 2 weeks. Estimated gestational age by LMP is 6 weeks 1 day. Quantitative beta HCG is 72,000. EXAM: OBSTETRIC <14 WK [place] AND TRANSVAGINAL OB US TECHNIQUE: Both transabdominal and transvaginal ultrasound examinations were performed for complete evaluation of the gestation as well as the maternal uterus, adnexal regions, and pelvic cul-de-sac. Transvaginal technique was performed to assess early pregnancy. COMPARISON:  None. FINDINGS: Intrauterine gestational sac: A single intrauterine gestational sac is identified. Yolk sac:  Present Embryo:  Present Cardiac Activity: Present Heart Rate: 98  bpm CRL:  2.4  mm   5 w   6 d                  [place] EDC: 11/23/2016 Subchorionic hemorrhage: Small subchorionic hemorrhage inferior  to the gestational sac. Maternal uterus/adnexae: Uterus is anteverted. No myometrial mass lesions identified. Both ovaries are visualized. Physiologic cysts are present. No abnormal adnexal masses. Small amount of free fluid in the pelvis. IMPRESSION: Single intrauterine pregnancy. Estimated gestational age by crown-rump length is 5 weeks 6 days. Small subchorionic hemorrhage. Electronically Signed   By: Burman Nieves M.D.   On: 04-Apr-2016 01:27   [place] Extrem Low Right Ltd  2016/04/04  CLINICAL DATA:  Abscess of right thigh. Boil lanced 4 days ago with subsequent infection. Redness and swelling at the right inner upper thigh. EXAM: ULTRASOUND RIGHT LOWER EXTREMITY LIMITED TECHNIQUE: Ultrasound examination of the lower extremity soft tissues was performed in the area of clinical concern. COMPARISON:  None FINDINGS: Ill-defined edema is demonstrated within the subcutaneous soft tissues of the inner right thigh, corresponding to the area of clinical concern, but no circumscribed abscess collection. Small hypoechoic collection at the skin surface likely corresponds to the site of recently lanced boil. IMPRESSION: Evidence of cellulitis.  No abscess collection demonstrated. Electronically Signed   By: Weyman Croon  Linde GillisMaynard M.D.   On: 03/29/2016 17:30    Microbiology: Recent Results (from the past 240 hour(s))  Wound or Superficial Culture     Status: None (Preliminary result)   Collection Time: 03/28/16 11:41 PM  Result Value Ref Range Status   Specimen Description   Final    WOUND RIGHT LEG [facility]    Special Requests Normal  Final   Gram Stain   Final    RARE WBC PRESENT,BOTH PMN AND MONONUCLEAR NO ORGANISMS SEEN    Culture PENDING  Incomplete   Report Status PENDING  Incomplete  Culture, Urine     Status: Abnormal   Collection Time: 03/29/16 11:19 AM  Result Value Ref Range Status   Specimen Description URINE, CLEAN CATCH  Final   Special Requests NONE  Final   Culture (A)   Final    <10,000 COLONIES/mL INSIGNIFICANT GROWTH [facility]formed [facility]    Report Status 03/30/2016 FINAL  Final     Labs: Basic Metabolic Panel:  Recent Labs Lab 03/28/16 1328 03/29/16 0333  NA 134* 135  K 4.2 3.5  CL 106 107  CO2 23 23  GLUCOSE 93 101*  BUN 6 9  CREATININE 0.80 0.68  CALCIUM 8.8* 8.2*   Liver Function Tests:  Recent Labs Lab 03/28/16 1328  AST 21  ALT 22  ALKPHOS 55  BILITOT 1.0  PROT 6.7  ALBUMIN 3.6   CBC:  Recent Labs Lab 03/28/16 1328 03/29/16 0333 03/30/16 0339  WBC 9.0 8.6 7.7  NEUTROABS 7.1  --   --   HGB 12.0 11.2* 12.2  HCT 35.7* 32.2* 35.8*  MCV 87.3 85.9 87.5  PLT 167 173 197    CBG: No results for input(s): GLUCAP in the last 168 hours.     SignedMeredeth Ide:  Debbera Wolken S MD.  [facility] 03/30/2016, 12:40 PM

## 2016-03-30 NOTE — Progress Notes (Signed)
Patient d/c home. Stable. 

## 2016-03-30 NOTE — Care Management Note (Signed)
Case Management Note  Patient Details  Name: Charlotte SanesBlessin Mecum MRN: [id number] Date of Birth: 30-Oct-1995  Subjective/Objective:         20 yo admitted with Cellulitis and abscess.           Action/Plan: From home.  Expected Discharge Date:                  Expected Discharge Plan:  Home/Self Care  In-House Referral:     Discharge planning Services  CM Consult  Post Acute Care Choice:    Choice offered to:     DME Arranged:    DME Agency:     HH Arranged:    HH Agency:     Status of Service:  In process, will continue to follow  Medicare Important Message Given:    Date Medicare IM Given:    Medicare IM give by:    Date Additional Medicare IM Given:    Additional Medicare Important Message give by:     If discussed at Long Length of Stay Meetings, dates discussed:    Additional Comments: Chart reviewed and no CM needs identified or communicated. CM will continue to follow. Bartholome BillCLEMENTS, Keir Foland H, RN 03/30/2016, 10:47 AM  [phone]

## 2016-03-31 LAB — AEROBIC CULTURE W GRAM STAIN (SUPERFICIAL SPECIMEN)

## 2016-03-31 LAB — AEROBIC CULTURE  (SUPERFICIAL SPECIMEN): SPECIAL REQUESTS: NORMAL

## 2016-04-06 ENCOUNTER — Other Ambulatory Visit: Payer: Self-pay | Admitting: Obstetrics and Gynecology

## 2017-03-22 IMAGING — US US OB TRANSVAGINAL
1 series · 14 of 28 positions shown · non-contrast
Comparison: None.

CLINICAL DATA: Nausea for 2 weeks. Estimated gestational age by LMP
is 6 weeks 1 day. Quantitative beta HCG is [DATE].

EXAM:
OBSTETRIC <14 WK US AND TRANSVAGINAL OB US
TECHNIQUE: Both transabdominal and transvaginal ultrasound examinations were
performed for complete evaluation of the gestation as well as the
maternal uterus, adnexal regions, and pelvic cul-de-sac.
Transvaginal technique was performed to assess early pregnancy.

[Series 1: us ob transvaginal · 0.22mm/px · 14 of 124 slices shown]
[im 5/124]
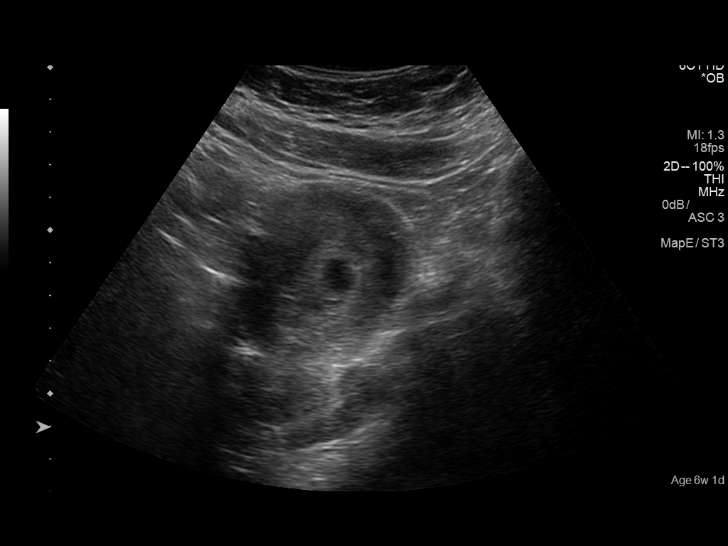
[im 14/124]
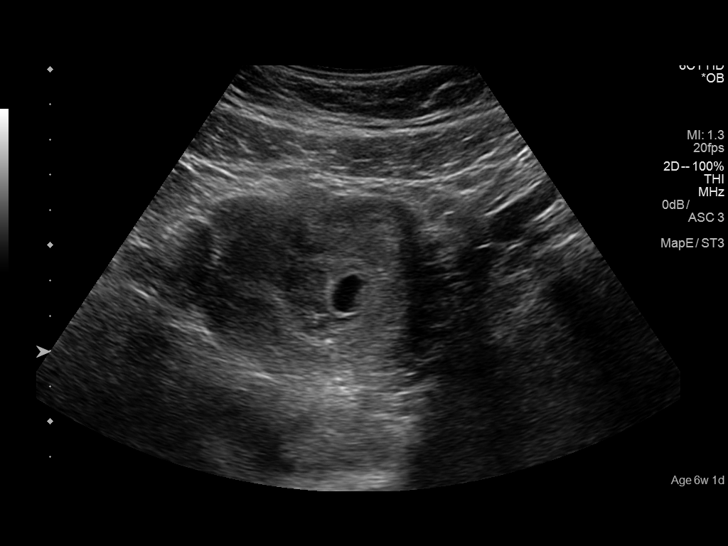
[im 23/124]
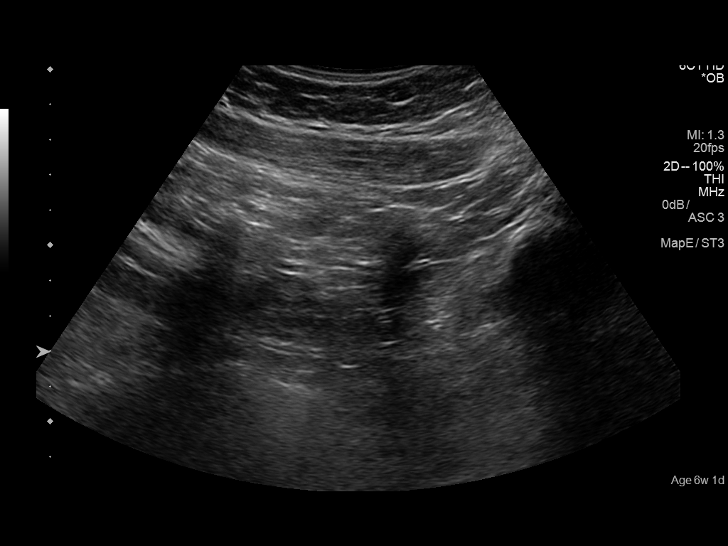
[im 32/124]
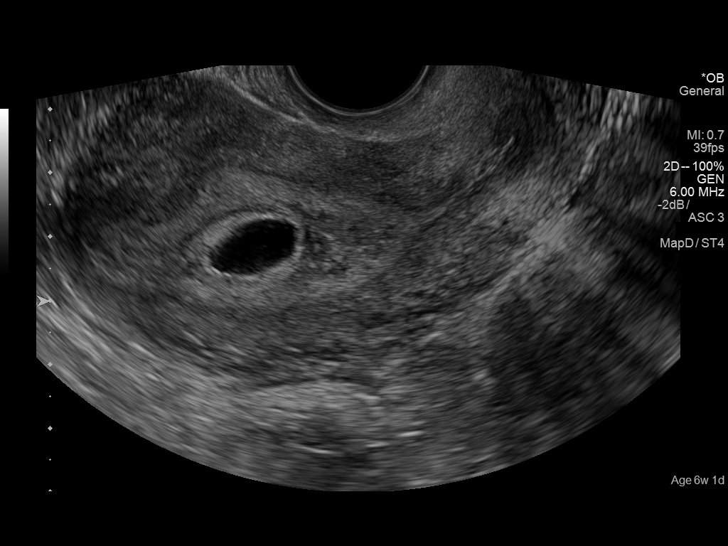
[im 42/124]
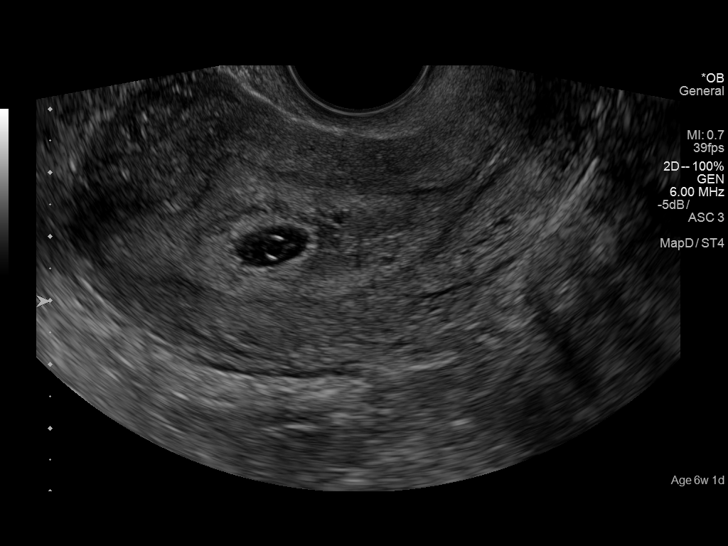
[im 51/124]
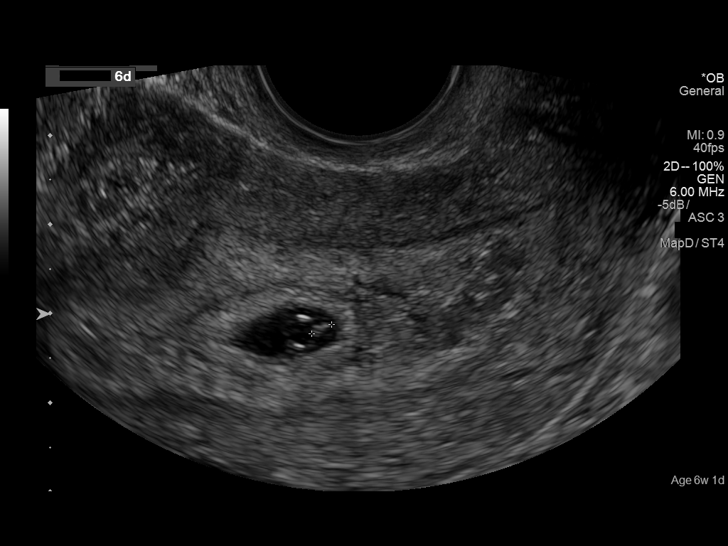
[im 60/124]
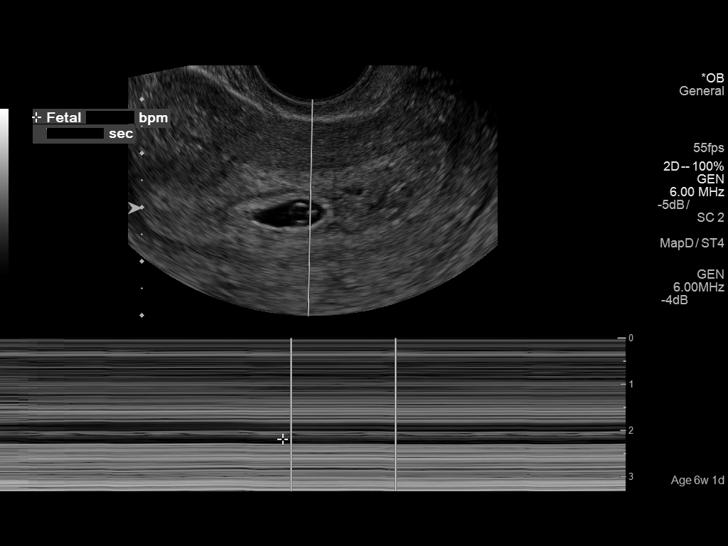
[im 69/124]
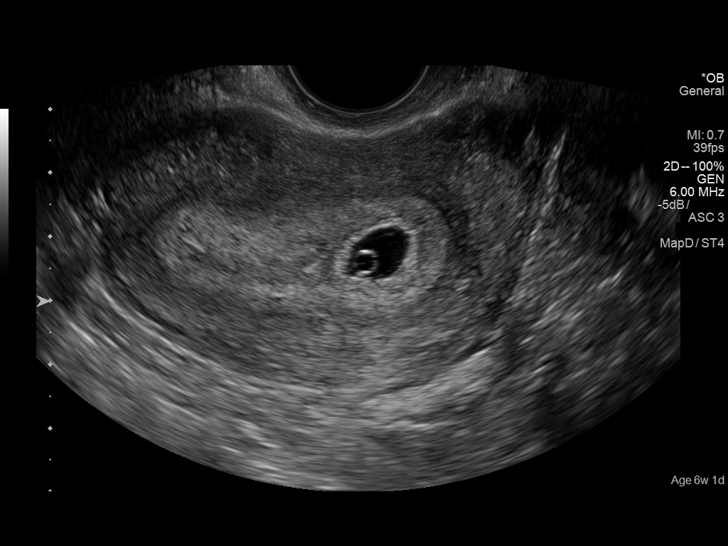
[im 78/124]
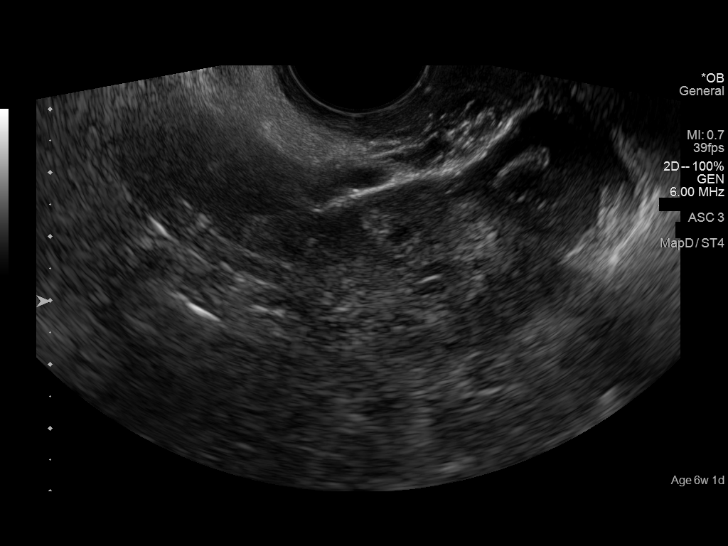
[im 87/124]
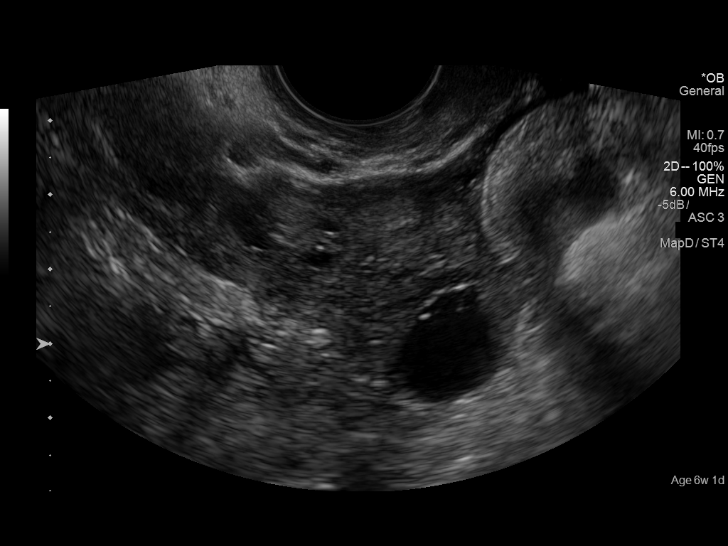
[im 96/124]
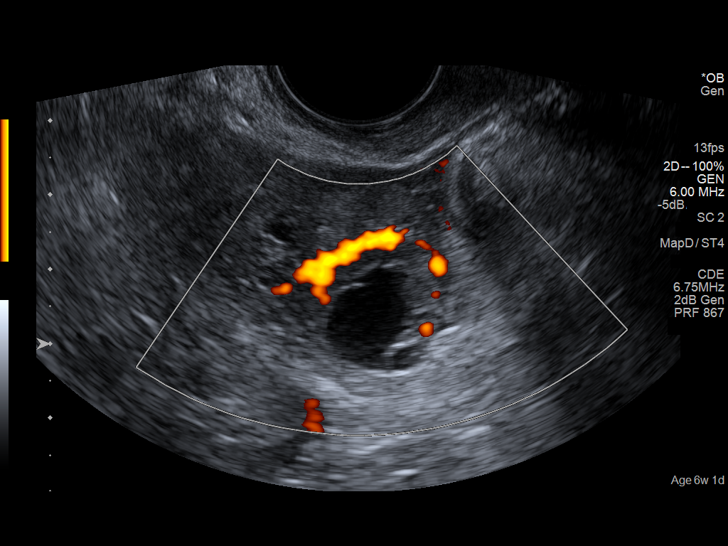
[im 105/124]
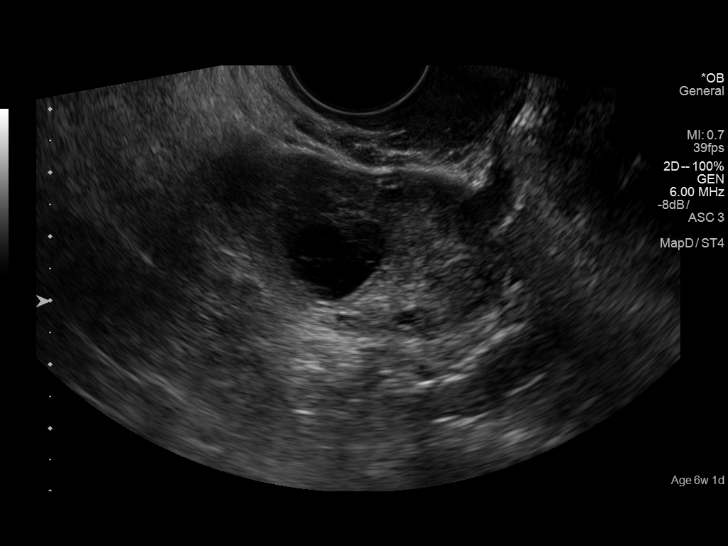
[im 114/124]
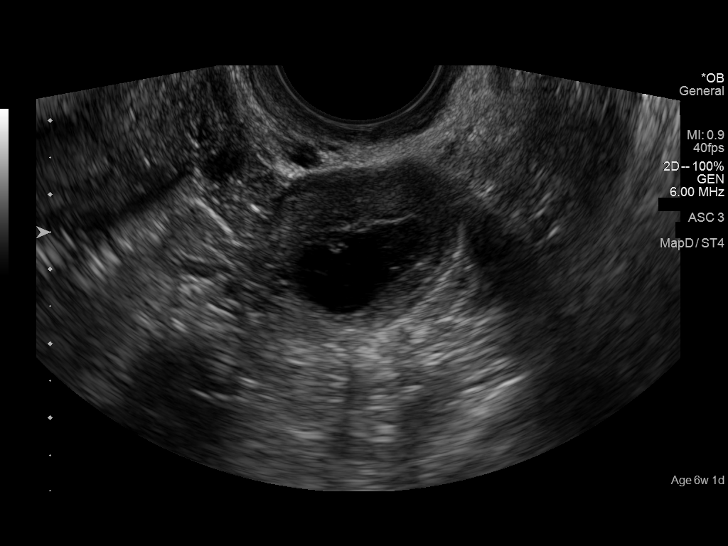
[im 124/124]
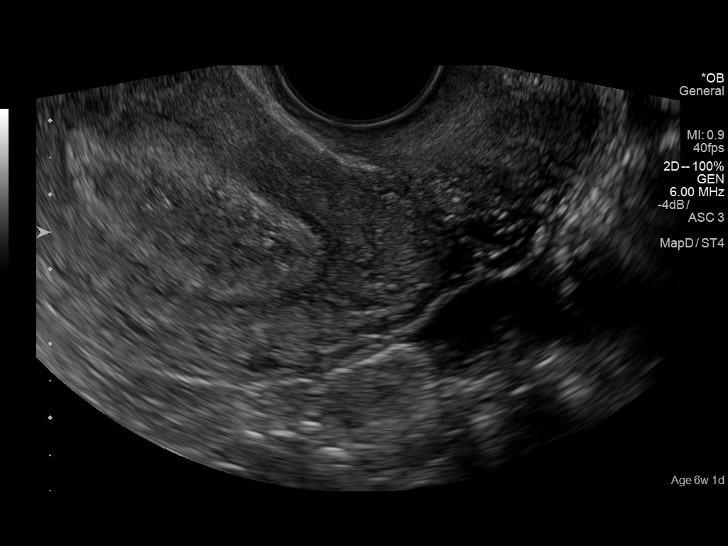

[14 of 28 positions shown; findings below may reference images not displayed]

FINDINGS: Intrauterine gestational sac: A single intrauterine gestational sac
is identified.

Yolk sac:  Present

Embryo:  Present

Cardiac Activity: Present

Heart Rate: 98  bpm

CRL:  2.4  mm   5 w   6 d                  US EDC: 11/23/2016

Subchorionic hemorrhage: Small subchorionic hemorrhage inferior to
the gestational sac.

Maternal uterus/adnexae: Uterus is anteverted. No myometrial mass
lesions identified. Both ovaries are visualized. Physiologic cysts
are present. No abnormal adnexal masses. Small amount of free fluid
in the pelvis.
IMPRESSION: Single intrauterine pregnancy. Estimated gestational age by
crown-rump length is 5 weeks 6 days. Small subchorionic hemorrhage.

## 2017-10-02 IMAGING — CT CT ABD-PELV W/O CM
2 of 4 series · 16 of 46 positions shown, 18 images · non-contrast
Comparison: None.

CLINICAL DATA: Left flank and left lower quadrant pain since last
night

EXAM:
CT ABDOMEN AND PELVIS WITHOUT CONTRAST
TECHNIQUE: Multidetector CT imaging of the abdomen and pelvis was performed
following the standard protocol without IV contrast.

[Series 2: abd/ pelvis 5.0 i30f 1 · axial · 0.78mm/px · z∈[+712,+1227]mm · 13 of 113 slices shown, 15 images]
[im 5/113  soft-tissue]
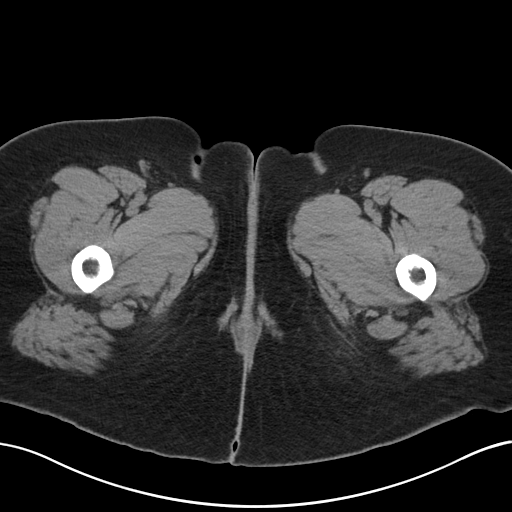
[im 5/113  bone]
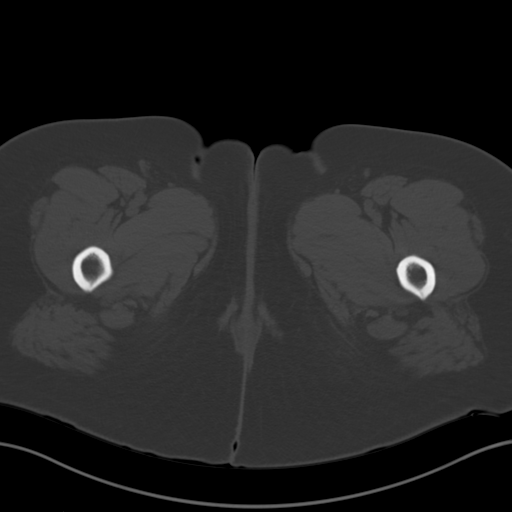
[im 15/113  soft-tissue]
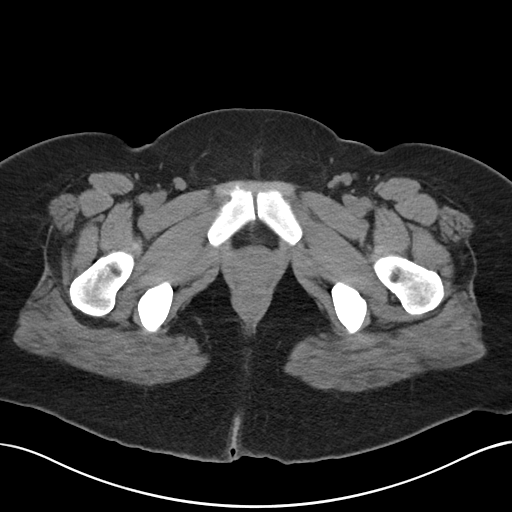
[im 24/113  soft-tissue]
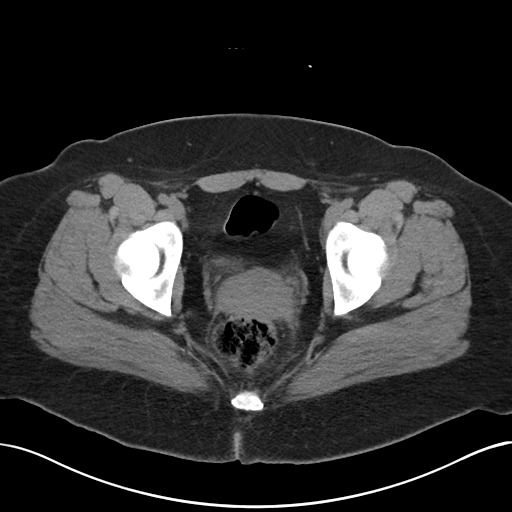
[im 33/113  soft-tissue]
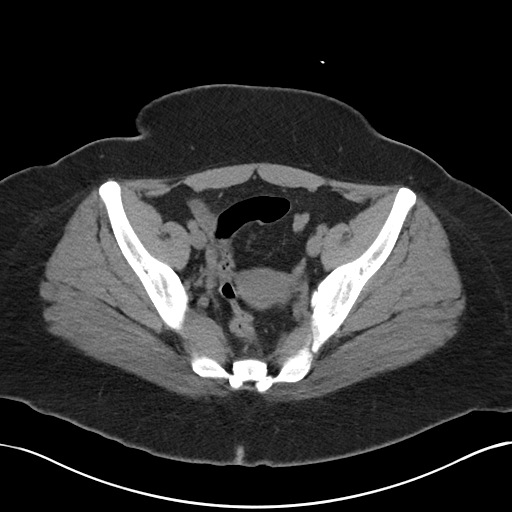
[im 38/113  soft-tissue]
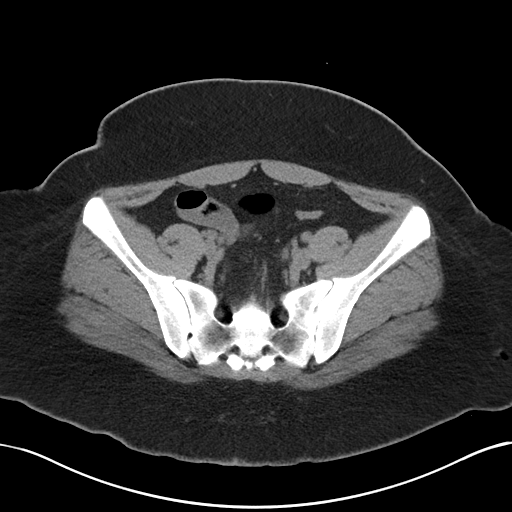
[im 47/113  soft-tissue]
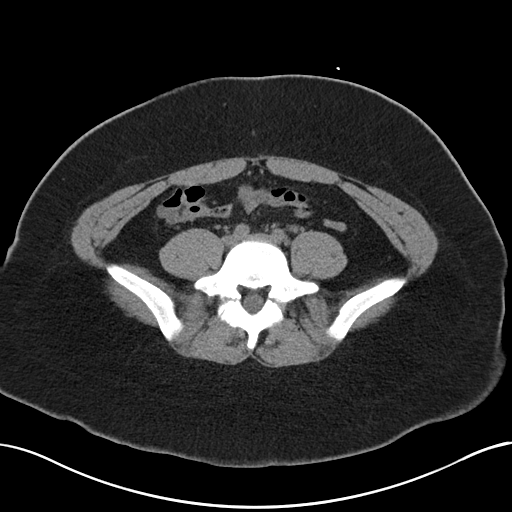
[im 57/113  soft-tissue]
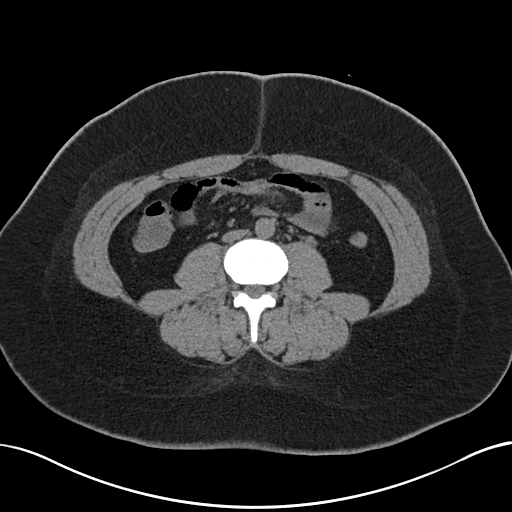
[im 66/113  soft-tissue]
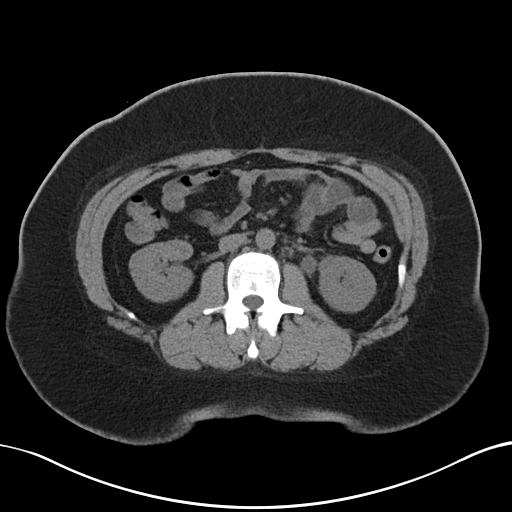
[im 75/113  soft-tissue]
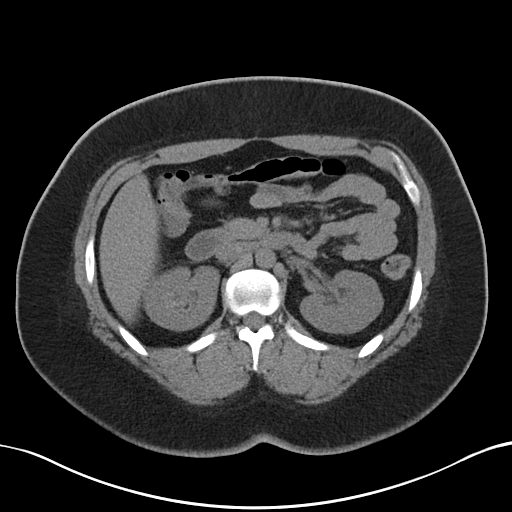
[im 75/113  bone]
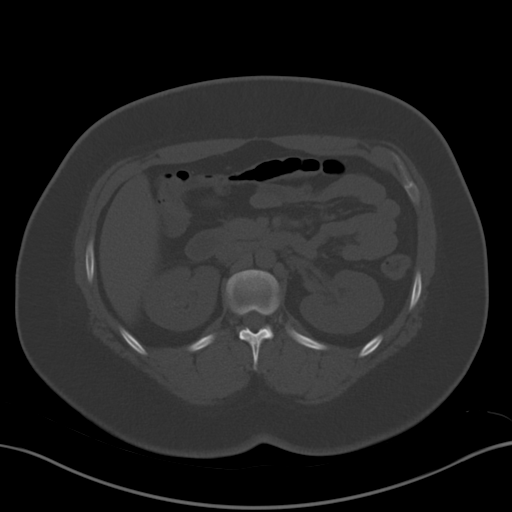
[im 80/113  soft-tissue]
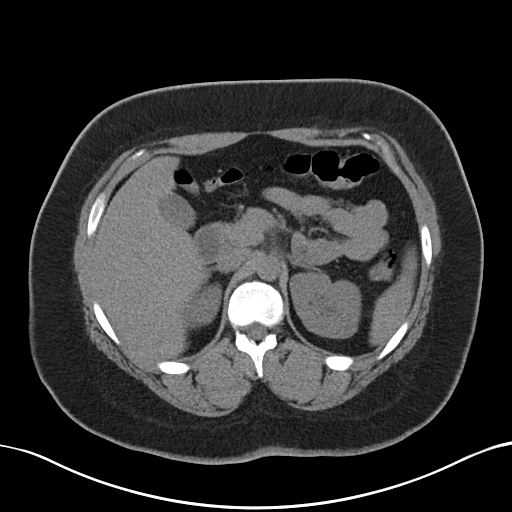
[im 89/113  soft-tissue]
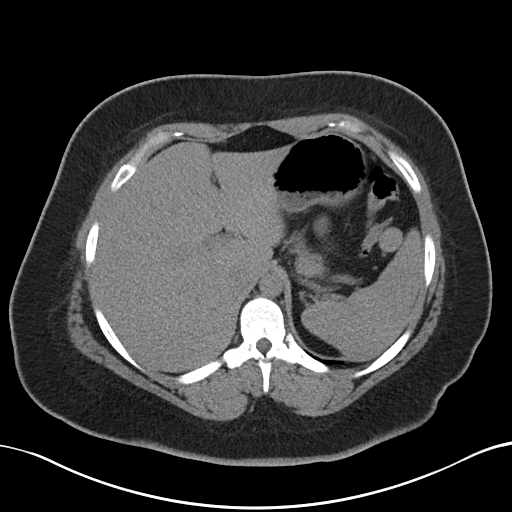
[im 99/113  soft-tissue]
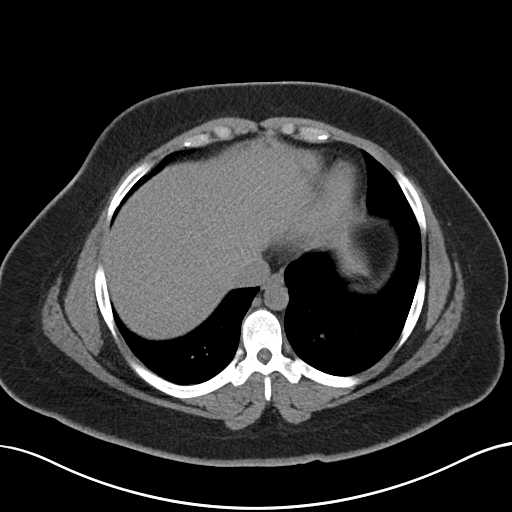
[im 108/113  soft-tissue]
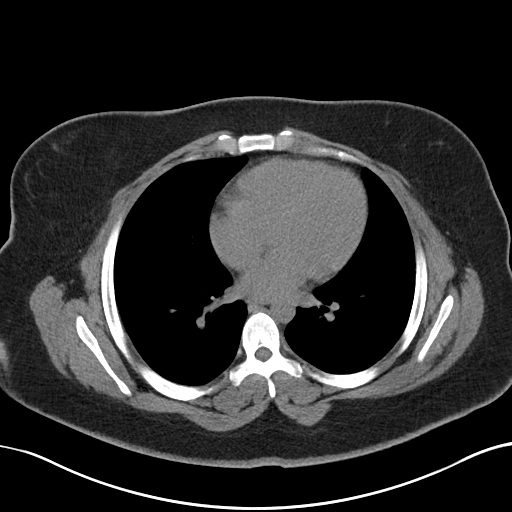

[Series 5: cor st · coronal · 0.86mm/px · 3 of 93 slices shown]
[im 31/93  soft-tissue]
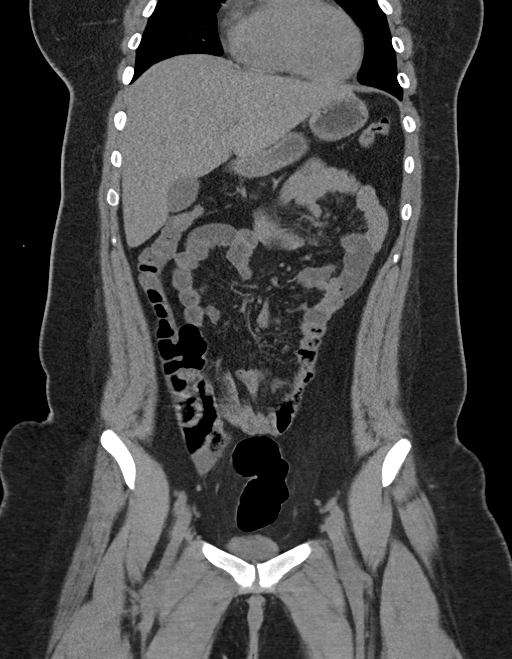
[im 41/93  soft-tissue]
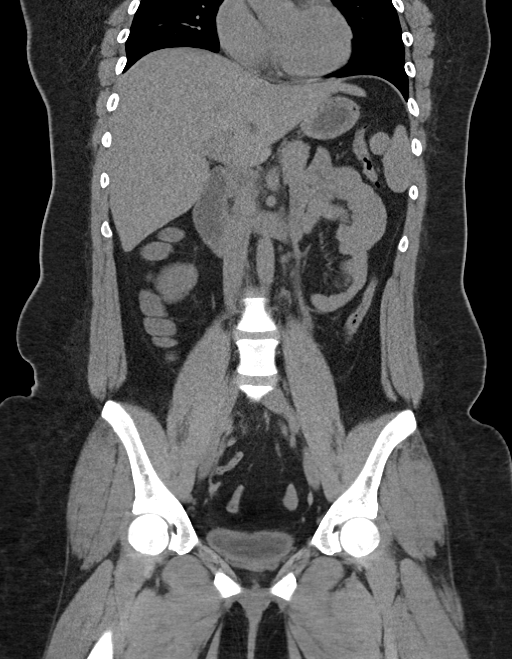
[im 52/93  soft-tissue]
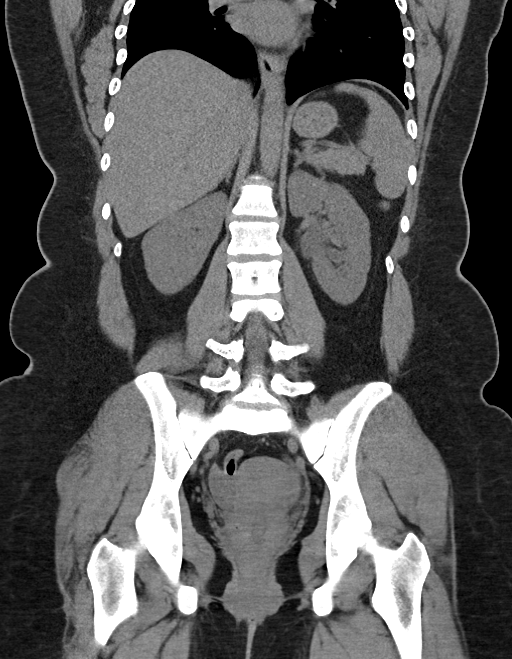

[16 of 46 positions shown; findings below may reference images not displayed]

FINDINGS: There is left hydroureteronephrosis with stranding surrounding the
left renal pelvis and ureter. No focal obstructing stone is
identified in the bilateral collecting systems. No nephrolithiasis
is identified bilaterally.

The liver, spleen, pancreas, gallbladder, adrenal glands are normal.
The aorta is normal. There is no abdominal lymphadenopathy. There is
no small bowel obstruction or diverticulitis. The appendix is
normal.

Partial fluid-filled bladder is normal. The uterus is normal. The
lung bases are clear. The there are bilateral pars defect at L5.
IMPRESSION: Left hydroureteronephrosis without focal obstructing stone.
Differential diagnosis includes recently passed stone or
pyelonephritis.

## 2018-04-15 IMAGING — US US EXTREM LOW*R* LIMITED
1 series · 14 of 25 positions shown · non-contrast
Comparison: None

CLINICAL DATA: Abscess of right thigh.

Boil lanced 4 days ago with subsequent infection. Redness and
swelling at the right inner upper thigh.
EXAM:
ULTRASOUND RIGHT LOWER EXTREMITY LIMITED
TECHNIQUE: Ultrasound examination of the lower extremity soft tissues was
performed in the area of clinical concern.

[Series 1: us extrem low*right* limited · 0.07mm/px · 14 of 25 slices shown]
[im 1/25]
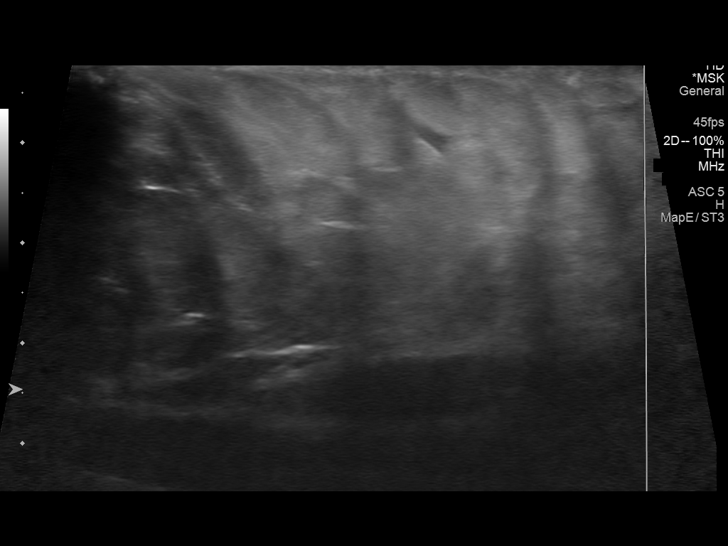
[im 3/25]
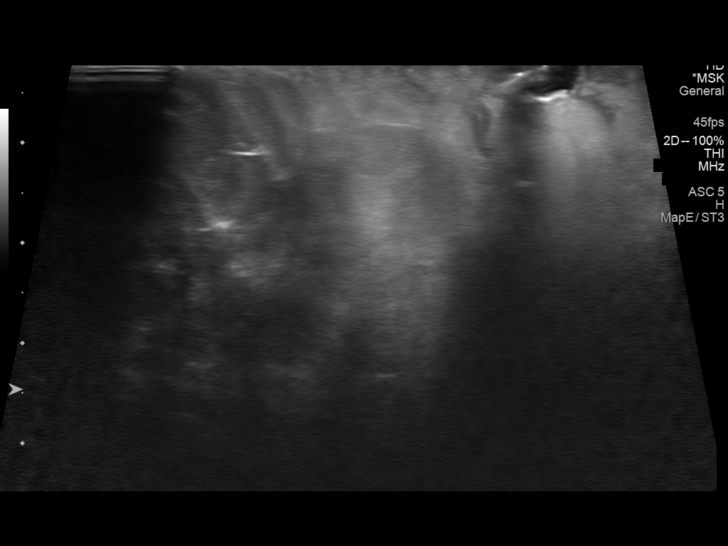
[im 5/25]
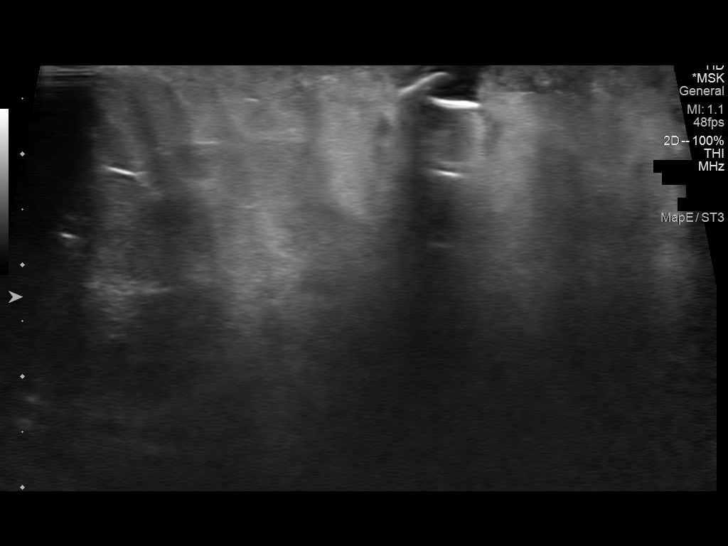
[im 7/25]
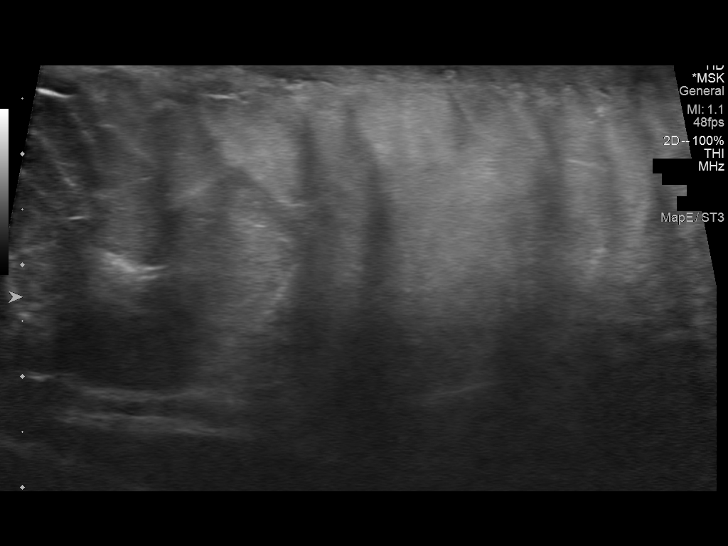
[im 9/25]
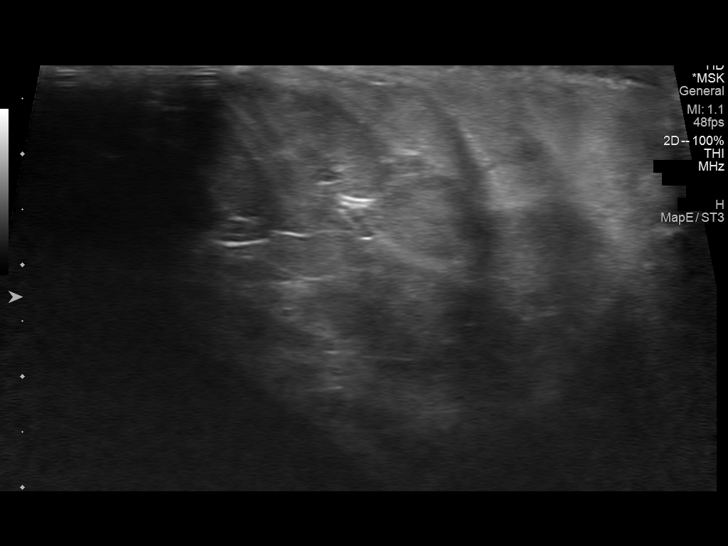
[im 10/25]
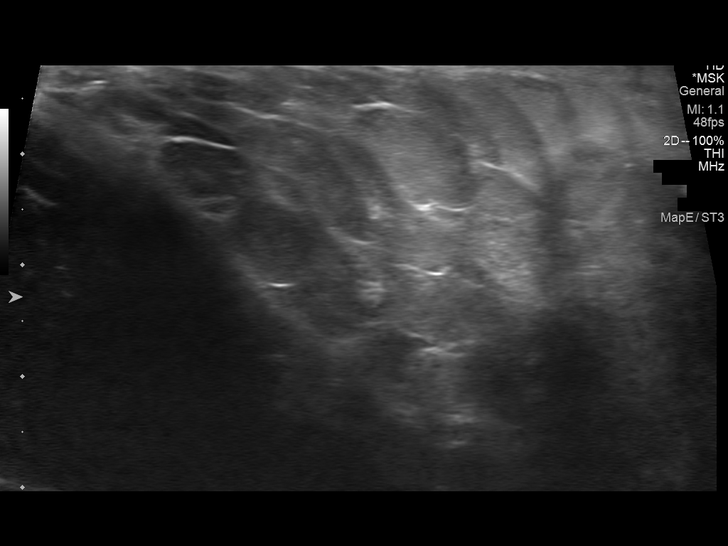
[im 12/25]
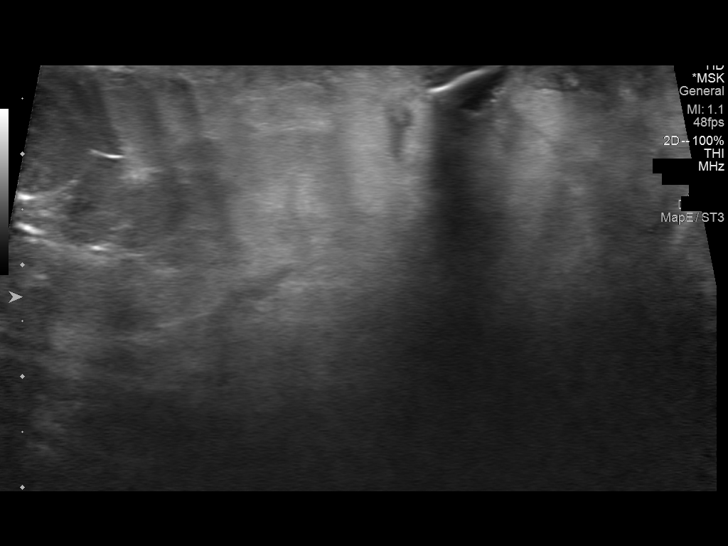
[im 14/25]
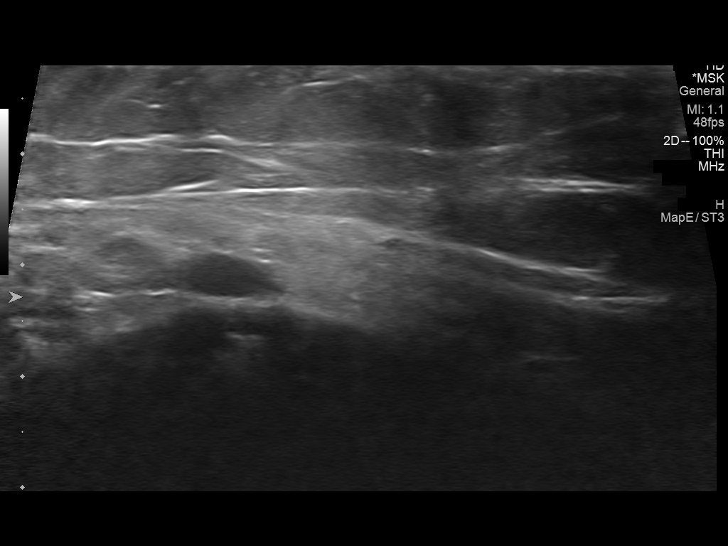
[im 16/25]
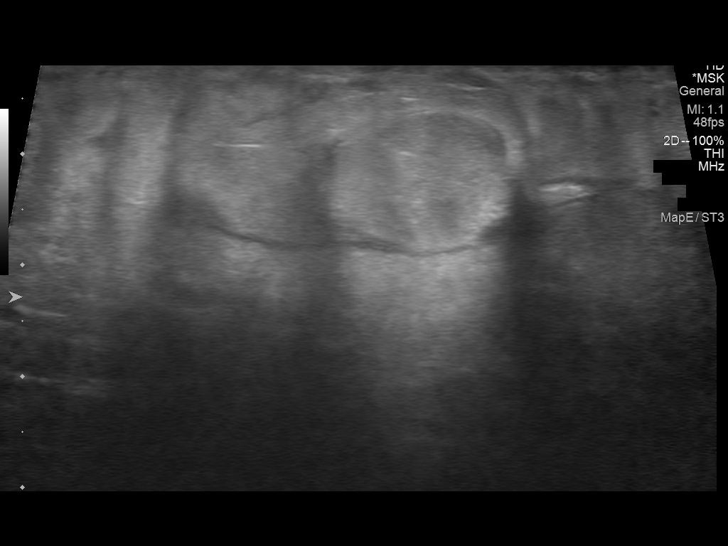
[im 17/25]
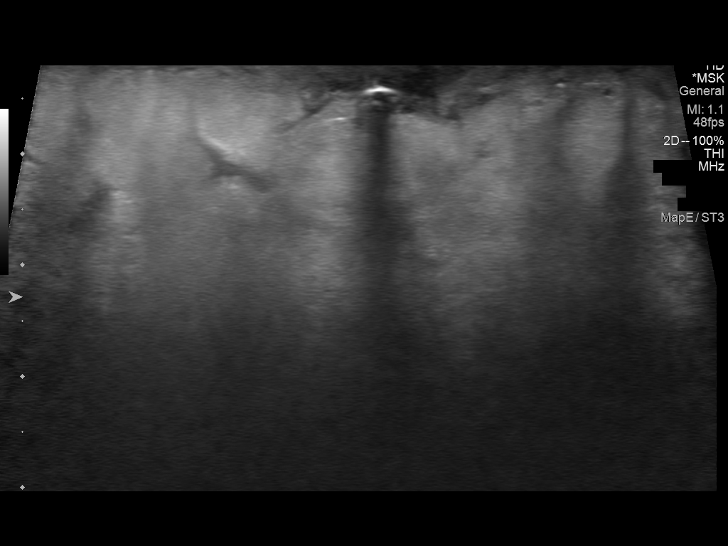
[im 19/25]
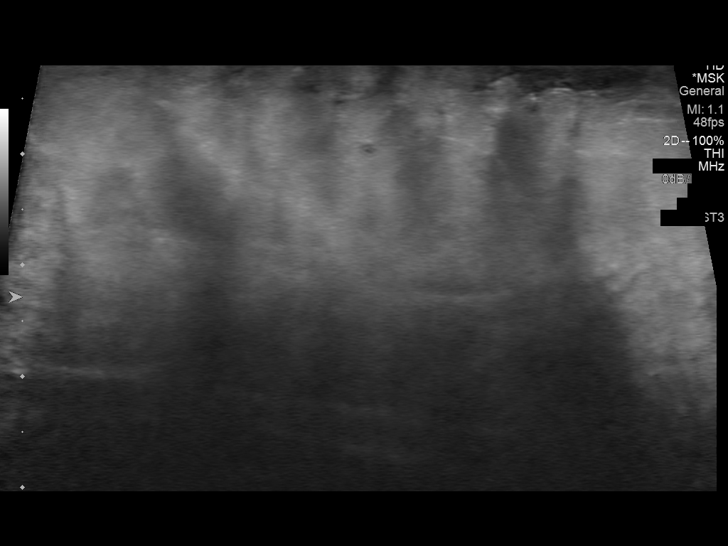
[im 21/25]
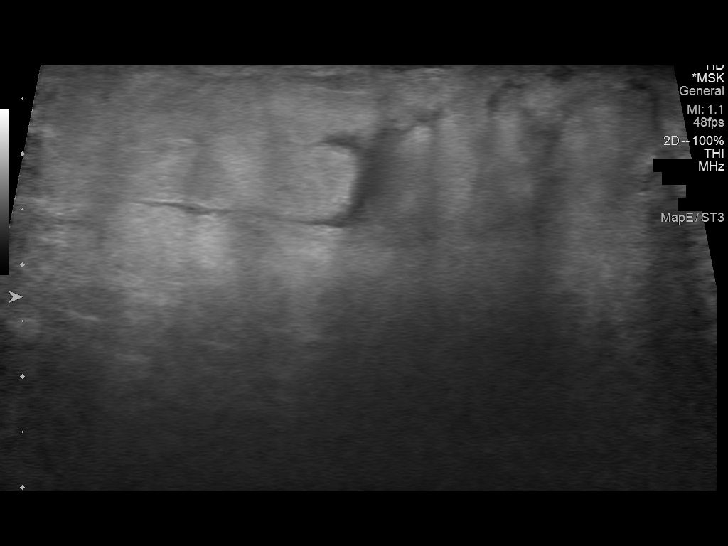
[im 23/25]
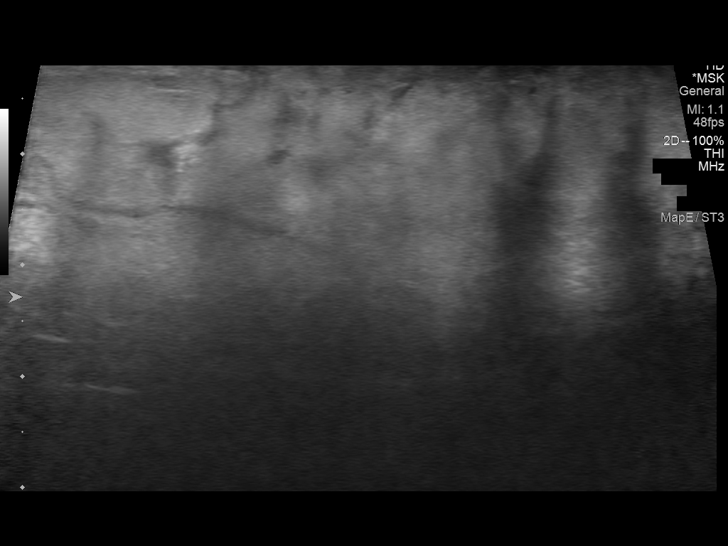
[im 25/25]
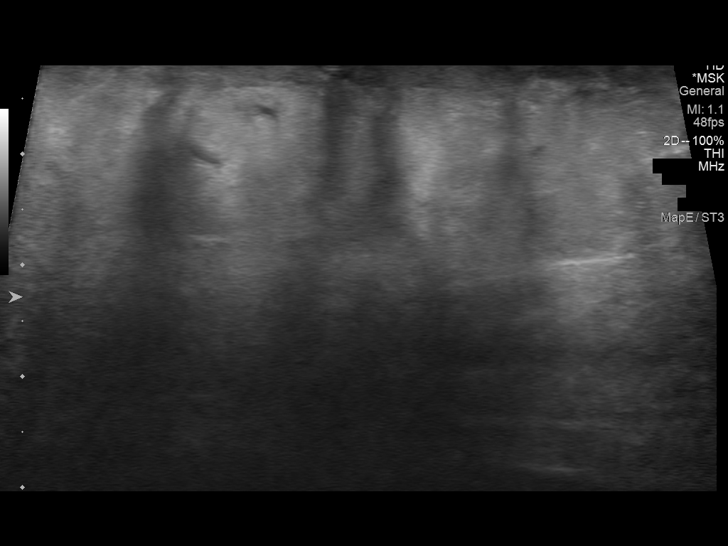

[14 of 25 positions shown; findings below may reference images not displayed]

FINDINGS: Ill-defined edema is demonstrated within the subcutaneous soft
tissues of the inner right thigh, corresponding to the area of
clinical concern, but no circumscribed abscess collection. Small
hypoechoic collection at the skin surface likely corresponds to the
site of recently lanced boil.
IMPRESSION: Evidence of cellulitis.  No abscess collection demonstrated.
